# Patient Record
Sex: Male | Born: 1970 | Race: White | Hispanic: No | Smoking: Never smoker
Health system: Southern US, Community
[De-identification: ages and names within clinical notes are randomized; demographics above are authoritative.]

## PROBLEM LIST (undated history)

## (undated) DIAGNOSIS — E785 Hyperlipidemia, unspecified: Secondary | ICD-10-CM

## (undated) DIAGNOSIS — G4733 Obstructive sleep apnea (adult) (pediatric): Secondary | ICD-10-CM

## (undated) DIAGNOSIS — R112 Nausea with vomiting, unspecified: Secondary | ICD-10-CM

## (undated) DIAGNOSIS — I5032 Chronic diastolic (congestive) heart failure: Secondary | ICD-10-CM

## (undated) DIAGNOSIS — I10 Essential (primary) hypertension: Secondary | ICD-10-CM

## (undated) DIAGNOSIS — R931 Abnormal findings on diagnostic imaging of heart and coronary circulation: Secondary | ICD-10-CM

## (undated) DIAGNOSIS — M1990 Unspecified osteoarthritis, unspecified site: Secondary | ICD-10-CM

## (undated) DIAGNOSIS — Z9889 Other specified postprocedural states: Secondary | ICD-10-CM

## (undated) DIAGNOSIS — K219 Gastro-esophageal reflux disease without esophagitis: Secondary | ICD-10-CM

## (undated) DIAGNOSIS — I4891 Unspecified atrial fibrillation: Secondary | ICD-10-CM

## (undated) DIAGNOSIS — K7581 Nonalcoholic steatohepatitis (NASH): Secondary | ICD-10-CM

## (undated) DIAGNOSIS — S069X9A Unspecified intracranial injury with loss of consciousness of unspecified duration, initial encounter: Secondary | ICD-10-CM

## (undated) HISTORY — PX: TYMPANOSTOMY TUBE PLACEMENT: SHX32

## (undated) HISTORY — DX: Unspecified atrial fibrillation: I48.91

## (undated) HISTORY — DX: Nonalcoholic steatohepatitis (NASH): K75.81

## (undated) HISTORY — PX: TONSILLECTOMY: SUR1361

## (undated) HISTORY — DX: Obstructive sleep apnea (adult) (pediatric): G47.33

## (undated) HISTORY — DX: Chronic diastolic (congestive) heart failure: I50.32

## (undated) HISTORY — DX: Abnormal findings on diagnostic imaging of heart and coronary circulation: R93.1

## (undated) HISTORY — DX: Hyperlipidemia, unspecified: E78.5

---

## 1992-07-14 DIAGNOSIS — S069XAA Unspecified intracranial injury with loss of consciousness status unknown, initial encounter: Secondary | ICD-10-CM

## 1992-07-14 DIAGNOSIS — S069X9A Unspecified intracranial injury with loss of consciousness of unspecified duration, initial encounter: Secondary | ICD-10-CM

## 1992-07-14 HISTORY — DX: Unspecified intracranial injury with loss of consciousness of unspecified duration, initial encounter: S06.9X9A

## 1992-07-14 HISTORY — DX: Unspecified intracranial injury with loss of consciousness status unknown, initial encounter: S06.9XAA

## 2009-11-02 ENCOUNTER — Ambulatory Visit: Payer: Self-pay | Admitting: Ophthalmology

## 2016-03-19 ENCOUNTER — Encounter: Payer: Self-pay | Admitting: Emergency Medicine

## 2016-03-19 ENCOUNTER — Emergency Department
Admission: EM | Admit: 2016-03-19 | Discharge: 2016-03-19 | Disposition: A | Discharge status: Home / Self Care | Payer: 59 | Attending: Emergency Medicine | Admitting: Emergency Medicine

## 2016-03-19 DIAGNOSIS — R51 Headache: Secondary | ICD-10-CM

## 2016-03-19 DIAGNOSIS — H6092 Unspecified otitis externa, left ear: Secondary | ICD-10-CM | POA: Insufficient documentation

## 2016-03-19 DIAGNOSIS — H65192 Other acute nonsuppurative otitis media, left ear: Secondary | ICD-10-CM

## 2016-03-19 DIAGNOSIS — R519 Headache, unspecified: Secondary | ICD-10-CM

## 2016-03-19 HISTORY — DX: Unspecified intracranial injury with loss of consciousness of unspecified duration, initial encounter: S06.9X9A

## 2016-03-19 LAB — CBC
HCT: 45.8 % (ref 40.0–52.0)
Hemoglobin: 16.7 g/dL (ref 13.0–18.0)
MCH: 30 pg (ref 26.0–34.0)
MCHC: 36.5 g/dL — ABNORMAL HIGH (ref 32.0–36.0)
MCV: 82.2 fL (ref 80.0–100.0)
Platelets: 249 10*3/uL (ref 150–440)
RBC: 5.57 MIL/uL (ref 4.40–5.90)
RDW: 13.8 % (ref 11.5–14.5)
WBC: 6.5 10*3/uL (ref 3.8–10.6)

## 2016-03-19 LAB — BASIC METABOLIC PANEL
Anion gap: 7 (ref 5–15)
BUN: 14 mg/dL (ref 6–20)
CO2: 25 mmol/L (ref 22–32)
Calcium: 9.4 mg/dL (ref 8.9–10.3)
Chloride: 106 mmol/L (ref 101–111)
Creatinine, Ser: 0.93 mg/dL (ref 0.61–1.24)
GFR calc Af Amer: >60 mL/min (ref >60)
GFR calc non Af Amer: >60 mL/min (ref >60)
Glucose, Bld: 110 mg/dL — ABNORMAL HIGH (ref 65–99)
Potassium: 4.4 mmol/L (ref 3.5–5.1)
Sodium: 138 mmol/L (ref 135–145)

## 2016-03-19 MED ORDER — BUTALBITAL-APAP-CAFFEINE 50-325-40 MG PO TABS: 1.0000 | ORAL_TABLET | Freq: Three times a day (TID) | ORAL | 0 refills | Status: AC | PRN

## 2016-03-19 MED ORDER — CIPROFLOXACIN-DEXAMETHASONE 0.3-0.1 % OT SUSP: 4.0000 [drp] | Freq: Two times a day (BID) | OTIC | 0 refills | Status: DC

## 2016-03-19 NOTE — ED Triage Notes (Signed)
Pt complains of pain to back of head on left side that started Sunday night. Pt states he noticed his vision was blurred on Sunday as well but has gotten better. Pt also complains of ringing/throbbing to left ear for one week.
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
## 2016-03-19 NOTE — Discharge Instructions (Signed)
As we discussed, it appears that you have swimmers ear (otitis externa) in the left ear and the very beginning of it in the right ear.  Additionally he seemed to have a little bit of fluid behind her left ear drum.  At this point you do not need oral antibiotics but you would benefit from some eardrops to treat the otitis externa.  Please take eardrops for the full course of treatment recommended.  Follow up with an ENT specialist if you continue to have issues in about a week.  Additionally, we discussed your headache.  It may or may not be related to your ear problems, but since your headache has completely resolved at this time and your exam is otherwise reassuring, we did not proceed with any imaging today.  If you develop new or worsening symptoms that concern you, please return immediately to the emergency department.

## 2016-03-19 NOTE — ED Notes (Signed)
Pt unable to sign d/c instructions due to e sig not working.

## 2017-03-22 ENCOUNTER — Encounter: Payer: Self-pay | Admitting: *Deleted

## 2017-03-22 ENCOUNTER — Ambulatory Visit
Admission: EM | Admit: 2017-03-22 | Discharge: 2017-03-22 | Disposition: A | Discharge status: Home / Self Care | Payer: 59 | Attending: Family Medicine | Admitting: Family Medicine

## 2017-03-22 ENCOUNTER — Ambulatory Visit (INDEPENDENT_AMBULATORY_CARE_PROVIDER_SITE_OTHER): Payer: 59

## 2017-03-22 DIAGNOSIS — M7651 Patellar tendinitis, right knee: Secondary | ICD-10-CM

## 2017-03-22 DIAGNOSIS — M25561 Pain in right knee: Secondary | ICD-10-CM | POA: Diagnosis not present

## 2017-03-22 DIAGNOSIS — M25461 Effusion, right knee: Secondary | ICD-10-CM | POA: Diagnosis not present

## 2017-03-22 DIAGNOSIS — I10 Essential (primary) hypertension: Secondary | ICD-10-CM | POA: Diagnosis not present

## 2017-03-22 DIAGNOSIS — S8391XA Sprain of unspecified site of right knee, initial encounter: Secondary | ICD-10-CM

## 2017-03-22 HISTORY — DX: Essential (primary) hypertension: I10

## 2017-03-22 MED ORDER — MELOXICAM 15 MG PO TABS: 15.0000 mg | ORAL_TABLET | Freq: Every day | ORAL | 0 refills | Status: DC

## 2017-03-22 NOTE — ED Provider Notes (Signed)
[facility]    CSN: [id number] Arrival date & time: 03/22/17  1106     History   Chief Complaint Chief Complaint  Patient presents with  . Knee Pain    HPI Ernest Boyd is a 46 y.o. male.   Patient is a 46 year old white male who injured his right knee on Friday. 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 Think much of it according to him. However when he got home his 80s and 90 pounds dog which she states is a mix between a pitbull and a lab greeted him and was very playful. He tried to brace himself as a dog was jumping on him but felt his right knee give way and as he puts it felt bone bone. States started having trouble with right knee right away. The knee was more swollen yesterday his accident gone down some they still having some discomfort in the right knee. He has abrasion over the right knee that occurred at work. He's had a history of brain injury hypertension no pertinent surgical history he is on no chronic medications. He does not smoke tobacco and father with history of hypertension. He is allergic to Dilantin   The history is provided by the patient. No language interpreter was used.  Knee Pain  Location:  Knee Time since incident:  3 days Injury: yes   Mechanism of injury comment:  Being pushed by the dog Knee location:  R knee Pain details:    Quality:  Aching, pressure and shooting   Severity:  Moderate   Onset quality:  Sudden   Duration:  3 days   Timing:  Constant   Progression:  Partially resolved Chronicity:  New Dislocation: no   Foreign body present:  No foreign bodies Prior injury to area:  Yes Relieved by:  Nothing Worsened by:  Nothing   Past Medical History:  Diagnosis Date  . 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[place])   . Hypertension     There are no active problems to display for this patient.   History reviewed. No  pertinent surgical history.     Home Medications    Prior to Admission medications   Medication Sig Start Date End Date Taking? Authorizing Provider  ciprofloxacin-dexamethasone (CIPRODEX) otic suspension Place 4 drops into both ears 2 (two) times daily. Continue treatment for 7 days. 03/19/16   Hinda Kehr, MD    Family History Family History  Problem Relation Age of Onset  . Hypertension Father     Social History Social History  Substance Use Topics  . Smoking status: Never Smoker  . Smokeless tobacco: Never Used  . Alcohol use No     Allergies   Dilantin [phenytoin]   Review of Systems Review of Systems  Musculoskeletal: Positive for arthralgias, gait problem, joint swelling and myalgias.  All other systems reviewed and are negative.    Physical Exam Triage Vital Signs ED Triage Vitals  Enc Vitals Group     BP 03/22/17 1119 (!) 180/122     Pulse Rate 03/22/17 1119 66     Resp 03/22/17 1119 16     Temp 03/22/17 1119 98.2 F (36.8 C)     Temp Source 03/22/17 1119 Oral     SpO2 03/22/17 1119 100 %     Weight 03/22/17 1120 240 lb (108.9 kg)  Height 03/22/17 1120 5' 11"  (1.803 m)     Head Circumference --      Peak Flow --      Pain Score 03/22/17 1120 5     Pain Loc --      Pain Edu? --      Excl. in [place]? --    No data found.   Updated Vital Signs BP (!) 180/122 (BP Location: Right Arm)   Pulse 66   Temp 98.2 F (36.8 C) (Oral)   Resp 16   Ht 5' 11"  (1.803 m)   Wt 240 lb (108.9 kg)   SpO2 100%   BMI 33.47 kg/m   Visual Acuity Right Eye Distance:   Left Eye Distance:   Bilateral Distance:    Right Eye Near:   Left Eye Near:    Bilateral Near:     Physical Exam  Constitutional: He is oriented to person, place, and time. He appears well-developed and well-nourished. No distress.  HENT:  Head: Normocephalic and atraumatic.  Right Ear: External ear normal.  Left Ear: External ear normal.  Nose: Nose normal.  Eyes: Pupils are equal,  round, and reactive to light.  Neck: Normal range of motion.  Cardiovascular: Normal rate and regular rhythm.   Pulmonary/Chest: Effort normal.  Musculoskeletal: He exhibits tenderness.       Right knee: He exhibits swelling. He exhibits normal range of motion, no ecchymosis and no deformity. Tenderness found.       Legs: The knee joint itself appears be stable he has some mild effusion around the knee and tenderness on the superior and inferior lateral sides of the right knee the some mild tenderness in the popliteal area as well but overall the knee appears to be stable  Neurological: He is alert and oriented to person, place, and time.  Skin: Skin is warm. He is not diaphoretic.  Psychiatric: He has a normal mood and affect.  Vitals reviewed.    UC Treatments / Results  Labs (all labs ordered are listed, but only abnormal results are displayed) Labs Reviewed - No data to display  EKG  EKG Interpretation None       Radiology No results found.  Procedures Procedures (including critical care time)  Medications Ordered in UC Medications - No data to display   Initial Impression / Assessment and Plan / UC Course  I have reviewed the triage vital signs and the nursing notes.  Pertinent labs & imaging results that were available during my care of the patient were reviewed by me and considered in my medical decision making (see chart for details).     We'll x-ray the right knee if stable but normal will place on Mobic 15 mg. Recommend he get a knee sleeve at the local drugstore or supply shop work note given for today and tomorrow needed Mobic 15 mg 1 tablet by mouth daily if not better in 1-2 weeks please see PCP may need orthopedic referral that time.   Patient also had markedly elevated blood pressure his blood pressure was rechecked and it has come down some. It turns out the patient has been on blood pressure was before. For whatever reason he is no longer taking blood  pressure medicine information with PCP was given to him with instructions that he needs follow-up with his PCP to get back on blood pressure medicine. Elevated whether we should start him on blood pressure medicine now but since he has a history of hypertension and is  been off his medicine will hold off on putting him back on medication now  Final Clinical Impressions(s) / UC Diagnoses   Final diagnoses:  None    New Prescriptions New Prescriptions   No medications on file   Note: This dictation was prepared with Dragon dictation along with smaller phrase technology. Any transcriptional errors that result from this process are unintentional.  Controlled Substance Prescriptions [place] Controlled Substance Registry consulted? Not Applicable   Frederich Cha, MD 03/24/17 2119

## 2017-03-22 NOTE — ED Triage Notes (Signed)
Patient injured his right knee 2 days ago when he hit his right knee cap against a piece of metal.

## 2017-10-25 ENCOUNTER — Emergency Department
Admission: EM | Admit: 2017-10-25 | Discharge: 2017-10-26 | Disposition: A | Discharge status: Home / Self Care | Payer: 59 | Attending: Emergency Medicine | Admitting: Emergency Medicine

## 2017-10-25 ENCOUNTER — Other Ambulatory Visit: Payer: Self-pay

## 2017-10-25 DIAGNOSIS — R1010 Upper abdominal pain, unspecified: Secondary | ICD-10-CM | POA: Diagnosis not present

## 2017-10-25 DIAGNOSIS — Z5321 Procedure and treatment not carried out due to patient leaving prior to being seen by health care provider: Secondary | ICD-10-CM | POA: Diagnosis not present

## 2017-10-25 LAB — CBC
HCT: 42.9 % (ref 40.0–52.0)
Hemoglobin: 15.2 g/dL (ref 13.0–18.0)
MCH: 29.2 pg (ref 26.0–34.0)
MCHC: 35.4 g/dL (ref 32.0–36.0)
MCV: 82.6 fL (ref 80.0–100.0)
Platelets: 250 10*3/uL (ref 150–440)
RBC: 5.2 MIL/uL (ref 4.40–5.90)
RDW: 13.4 % (ref 11.5–14.5)
WBC: 9.8 10*3/uL (ref 3.8–10.6)

## 2017-10-25 LAB — COMPREHENSIVE METABOLIC PANEL
ALT: 25 U/L (ref 17–63)
AST: 25 U/L (ref 15–41)
Albumin: 4.3 g/dL (ref 3.5–5.0)
Alkaline Phosphatase: 53 U/L (ref 38–126)
Anion gap: 8 (ref 5–15)
BUN: 14 mg/dL (ref 6–20)
CO2: 26 mmol/L (ref 22–32)
Calcium: 9.2 mg/dL (ref 8.9–10.3)
Chloride: 100 mmol/L — ABNORMAL LOW (ref 101–111)
Creatinine, Ser: 0.96 mg/dL (ref 0.61–1.24)
GFR calc Af Amer: >60 mL/min (ref >60)
GFR calc non Af Amer: >60 mL/min (ref >60)
Glucose, Bld: 145 mg/dL — ABNORMAL HIGH (ref 65–99)
Potassium: 3.8 mmol/L (ref 3.5–5.1)
Sodium: 134 mmol/L — ABNORMAL LOW (ref 135–145)
Total Bilirubin: 0.7 mg/dL (ref 0.3–1.2)
Total Protein: 7.9 g/dL (ref 6.5–8.1)

## 2017-10-25 LAB — LIPASE, BLOOD: Lipase: 25 U/L (ref 11–51)

## 2017-10-25 NOTE — ED Triage Notes (Signed)
Pt reports indigestion at 5am today and took tums with relief.  1700 today pt reports upper abd pain.  No v/d. Small bm today.  Pt alert.  No chest pain.

## 2017-10-25 NOTE — ED Notes (Signed)
Unable to void at this time

## 2017-10-26 LAB — TROPONIN I: Troponin I: <0.03 ng/mL (ref <0.03)

## 2018-01-07 ENCOUNTER — Encounter: Payer: Self-pay | Admitting: *Deleted

## 2018-01-19 ENCOUNTER — Encounter: Payer: Self-pay | Admitting: General Surgery

## 2018-01-19 ENCOUNTER — Ambulatory Visit: Payer: 59 | Admitting: General Surgery

## 2018-01-19 VITALS — BP 124/90 | HR 64 | Resp 10 | Ht 71.0 in | Wt 245.0 lb

## 2018-01-19 DIAGNOSIS — K8020 Calculus of gallbladder without cholecystitis without obstruction: Secondary | ICD-10-CM | POA: Diagnosis not present

## 2018-01-19 NOTE — Patient Instructions (Addendum)
He will decide about surgery and call back.  Laparoscopic Cholecystectomy Laparoscopic cholecystectomy is surgery to remove the gallbladder. The gallbladder is a pear-shaped organ that lies beneath the liver on the right side of the body. The gallbladder stores bile, which is a fluid that helps the body to digest fats. Cholecystectomy is often done for inflammation of the gallbladder (cholecystitis). This condition is usually caused by a buildup of gallstones (cholelithiasis) in the gallbladder. Gallstones can block the flow of bile, which can result in inflammation and pain. In severe cases, emergency surgery may be required. This procedure is done though small incisions in your abdomen (laparoscopic surgery). A thin scope with a camera (laparoscope) is inserted through one incision. Thin surgical instruments are inserted through the other incisions. In some cases, a laparoscopic procedure may be turned into a type of surgery that is done through a larger incision (open surgery). Tell a health care provider about:  Any allergies you have.  All medicines you are taking, including vitamins, herbs, eye drops, creams, and over-the-counter medicines.  Any problems you or family members have had with anesthetic medicines.  Any blood disorders you have.  Any surgeries you have had.  Any medical conditions you have.  Whether you are pregnant or may be pregnant. What are the risks? Generally, this is a safe procedure. However, problems may occur, including:  Infection.  Bleeding.  Allergic reactions to medicines.  Damage to other structures or organs.  A stone remaining in the common bile duct. The common bile duct carries bile from the gallbladder into the small intestine.  A bile leak from the cyst duct that is clipped when your gallbladder is removed.  What happens before the procedure? Staying hydrated Follow instructions from your health care provider about hydration, which  may include:  Up to 2 hours before the procedure - you may continue to drink clear liquids, such as water, clear fruit juice, black coffee, and plain tea.  Eating and drinking restrictions Follow instructions from your health care provider about eating and drinking, which may include:  8 hours before the procedure - stop eating heavy meals or foods such as meat, fried foods, or fatty foods.  6 hours before the procedure - stop eating light meals or foods, such as toast or cereal.  6 hours before the procedure - stop drinking milk or drinks that contain milk.  2 hours before the procedure - stop drinking clear liquids.  Medicines  Ask your health care provider about: ? Changing or stopping your regular medicines. This is especially important if you are taking diabetes medicines or blood thinners. ? Taking medicines such as aspirin and ibuprofen. These medicines can thin your blood. Do not take these medicines before your procedure if your health care provider instructs you not to.  You may be given antibiotic medicine to help prevent infection. General instructions  Let your health care provider know if you develop a cold or an infection before surgery.  Plan to have someone take you home from the hospital or clinic.  Ask your health care provider how your surgical site will be marked or identified. What happens during the procedure?  To reduce your risk of infection: ? Your health care team will wash or sanitize their hands. ? Your skin will be washed with soap. ? Hair may be removed from the surgical area.  An IV tube may be inserted into one of your veins.  You will be given one or  more of the following: ? A medicine to help you relax (sedative). ? A medicine to make you fall asleep (general anesthetic).  A breathing tube will be placed in your mouth.  Your surgeon will make several small cuts (incisions) in your abdomen.  The laparoscope will be inserted through one  of the small incisions. The camera on the laparoscope will send images to a TV screen (monitor) in the operating room. This lets your surgeon see inside your abdomen.  Air-like gas will be pumped into your abdomen. This will expand your abdomen to give the surgeon more room to perform the surgery.  Other tools that are needed for the procedure will be inserted through the other incisions. The gallbladder will be removed through one of the incisions.  Your common bile duct may be examined. If stones are found in the common bile duct, they may be removed.  After your gallbladder has been removed, the incisions will be closed with stitches (sutures), staples, or skin glue.  Your incisions may be covered with a bandage (dressing). The procedure may vary among health care providers and hospitals. What happens after the procedure?  Your blood pressure, heart rate, breathing rate, and blood oxygen level will be monitored until the medicines you were given have worn off.  You will be given medicines as needed to control your pain.  Do not drive for 24 hours if you were given a sedative. This information is not intended to replace advice given to you by your health care provider. Make sure you discuss any questions you have with your health care provider. Document Released: 06/30/2005 Document Revised: 01/20/2016 Document Reviewed: 12/17/2015 Elsevier Interactive Patient Education  2018 [employer].

## 2018-01-19 NOTE — Progress Notes (Signed)
Patient ID: Ernest Boyd, male   DOB: 01/29/1971, 47 y.o.   MRN: [id number]  Chief Complaint  Patient presents with  . Abdominal Pain    HPI NELS Boyd is a 47 y.o. male here today for a evaluation of abdominal pain  area. He has had 3 episodes of central and right upper abdominal pain over the past year. Radiating to his shoulder and back. The pain last 3-4 hours this last episode lasted 10 hours. He states the last episode was the worse and he had nausea.. Denies nausea or vomiting with the first 2 episodes. Each time was over eating and greasy foods. Bowels move twice a day, no bleeding. He has been watching what he eats and seems it has helped. Ultrasound done 10-26-17.  He is here with his girlfriend of 3 years, Cleotilde Neer.  HPI  Past Medical History:  Diagnosis Date  . 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[place])   . Hyperlipidemia   . Hypertension   . NASH (nonalcoholic steatohepatitis)     Past Surgical History:  Procedure Laterality Date  . TONSILLECTOMY      Family History  Problem Relation Age of Onset  . Hypertension Father     Social History Social History   Tobacco Use  . Smoking status: Never Smoker  . Smokeless tobacco: Never Used  Substance Use Topics  . Alcohol use: No  . Drug use: Not Currently    Allergies  Allergen Reactions  . Dilantin [Phenytoin] Rash    Current Outpatient Medications  Medication Sig Dispense Refill  . cholecalciferol (VITAMIN D) 1000 units tablet Take 1,000 Units by mouth daily.    Marland Kitchen co-enzyme Q-10 30 MG capsule Take 30 mg by mouth daily.    Marland Kitchen lisinopril (PRINIVIL,ZESTRIL) 10 MG tablet     . simvastatin (ZOCOR) 40 MG tablet      No current facility-administered medications for this visit.     Review of Systems Review of Systems  Constitutional: Negative.   Respiratory: Negative.   Cardiovascular: Negative.     Blood pressure 124/90, pulse 64, resp. rate 10, height 5' 11"  (1.803 m), weight 245 lb (111.1 kg), SpO2 97 %.  Physical  Exam Physical Exam  Constitutional: He is oriented to person, place, and time. He appears well-developed and well-nourished.  HENT:  Mouth/Throat: Oropharynx is clear and moist.  Eyes: No scleral icterus.  Cardiovascular: Normal rate, regular rhythm and normal heart sounds.  Pulmonary/Chest: Effort normal and breath sounds normal.  Abdominal: Soft. Normal appearance and bowel sounds are normal.    Neurological: He is alert and oriented to person, place, and time.  Skin: Skin is warm and dry.  Psychiatric: His behavior is normal.    Data Reviewed Laboratory studies obtained during an ED visit on October 25, 2017 were reviewed.  Normal white blood cell count, hemoglobin and MCV.  Platelet count 250,000.  Comprehensive metabolic panel notable for mild hyponatremia and elevated blood sugar 145.  Normal liver function studies.  Normal renal function.  Normal lipase.  The patient registered for the emergency room but after 6 hours return home without being seen.  Abdominal ultrasound dated October 26, 2017 completed at his PCPs office reported cholelithiasis without evidence of biliary tract obstruction.  Hepatic lobe enlargement on the right with fatty infiltration identified.  Assessment    Symptomatic cholelithiasis.    Plan    He will decide about surgery and call back.  Laparoscopic Cholecystectomy with Intraoperative Cholangiogram. The procedure, including it's potential risks  and complications (including but not limited to infection, bleeding, injury to intra-abdominal organs or bile ducts, bile leak, poor cosmetic result, sepsis and death) were discussed with the patient in detail. Non-operative options, including their inherent risks (acute calculous cholecystitis with possible choledocholithiasis or gallstone pancreatitis, with the risk of ascending cholangitis, sepsis, and death) were discussed as well. The patient expressed and understanding of what we discussed and he will call  back when he wishes to proceed with laparoscopic cholecystectomy. The patient further understands that if it is technically not possible, or it is unsafe to proceed laparoscopically, that I will convert to an open cholecystectomy.      HPI, Physical Exam, Assessment and Plan have been scribed under the direction and in the presence of Robert Bellow, MD. Karie Fetch, RN  I have completed the exam and reviewed the above documentation for accuracy and completeness.  I agree with the above.  Haematologist has been used and any errors in dictation or transcription are unintentional.  Hervey Ard, M.D., F.A.C.S.   Forest Gleason Keondra Haydu 01/19/2018, 8:44 PM

## 2019-08-28 IMAGING — CR DG KNEE AP/LAT W/ SUNRISE*R*
3 series · 3 of 3 positions shown · non-contrast
Comparison: None.

CLINICAL DATA: PT with posterior right knee pian with pain under
the knee cap as well x two days. No hx of previous injury or
trauma.NO hx of surgery.

EXAM:
RIGHT KNEE 3 VIEWS

[knee ap]
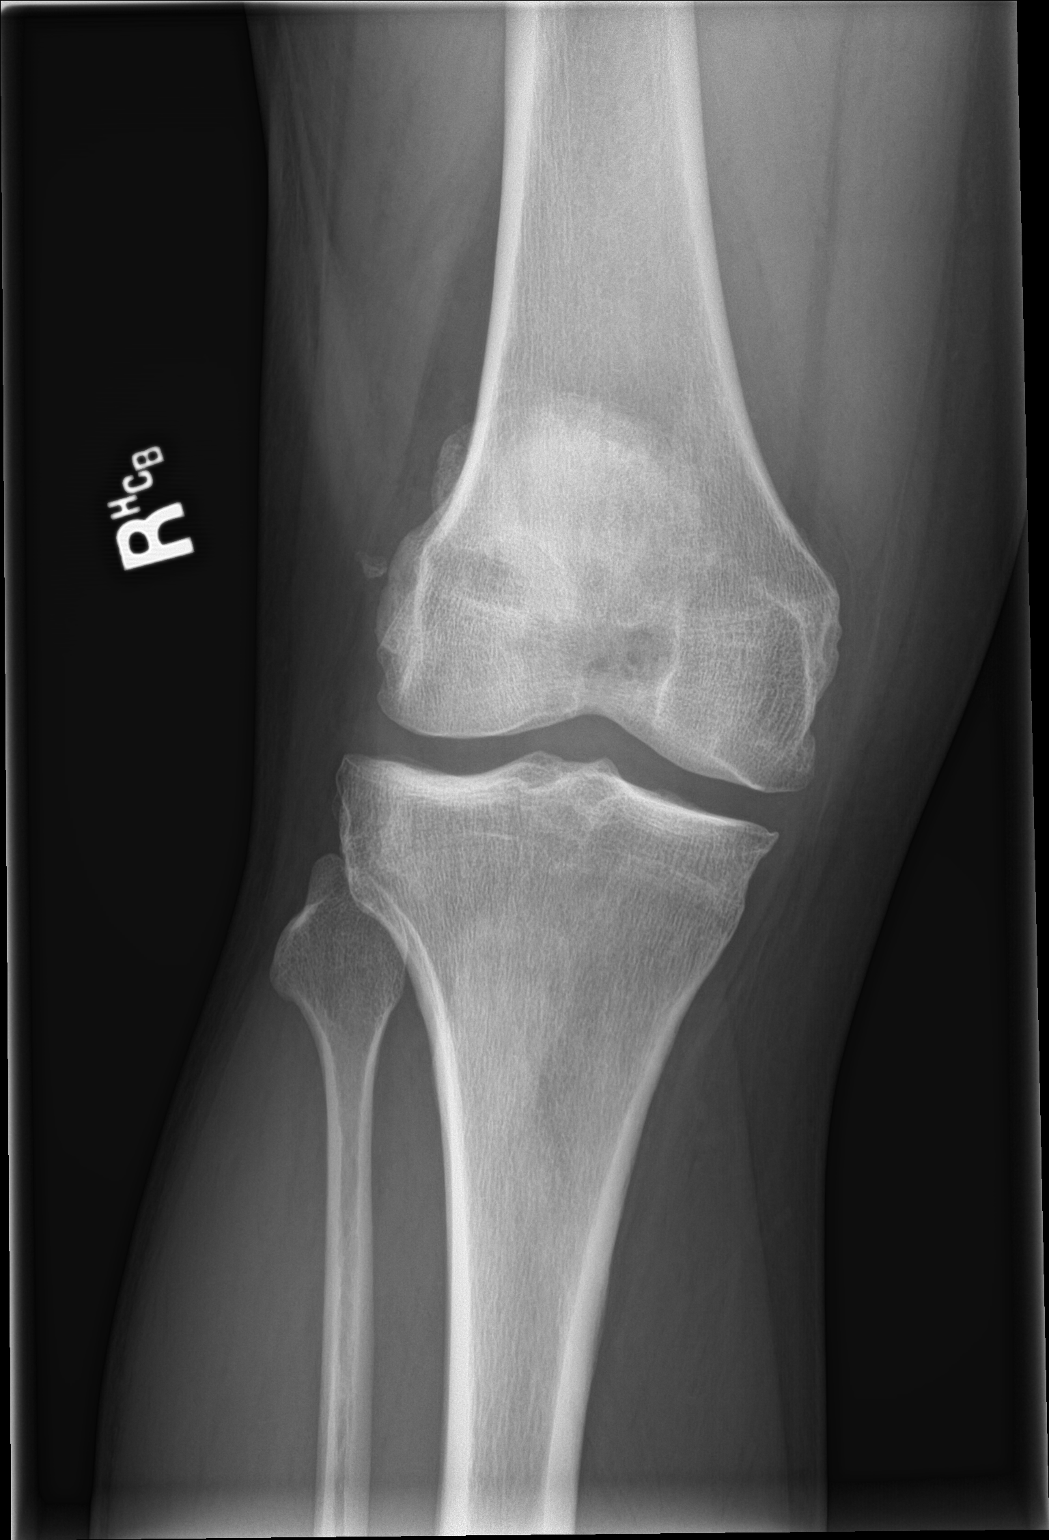

[knee lat]
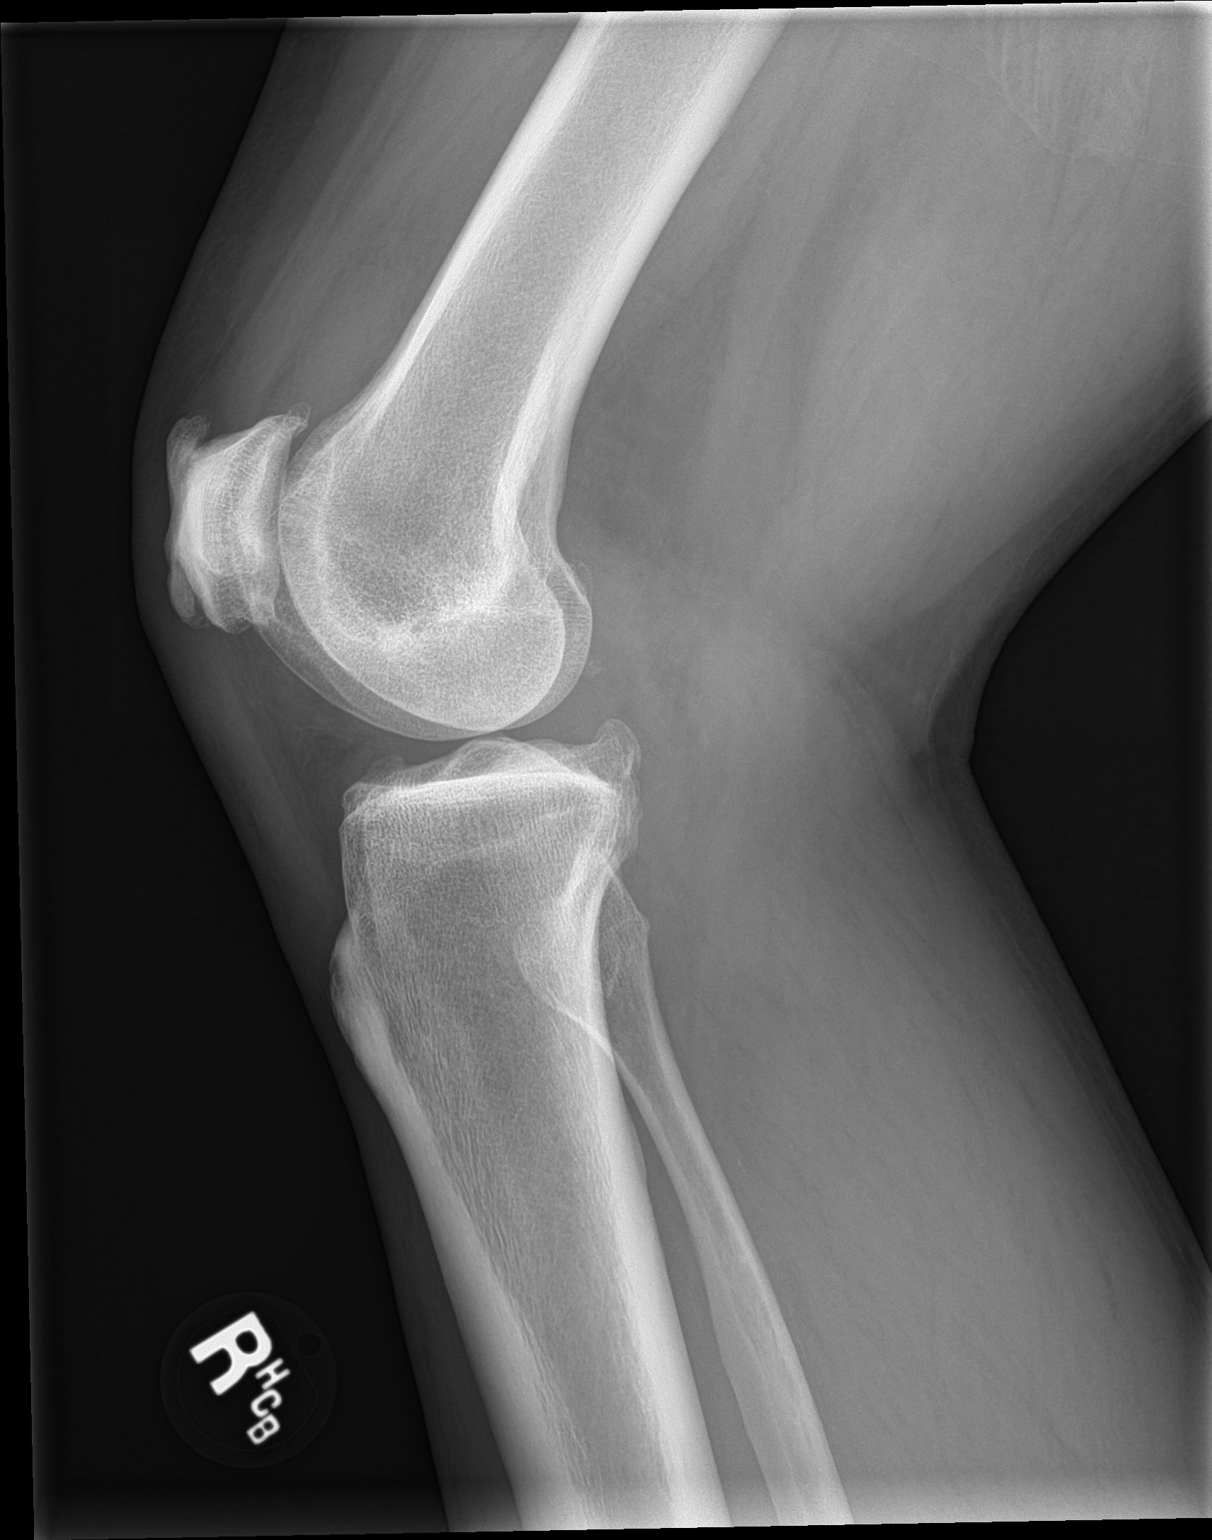

[patella skyline]
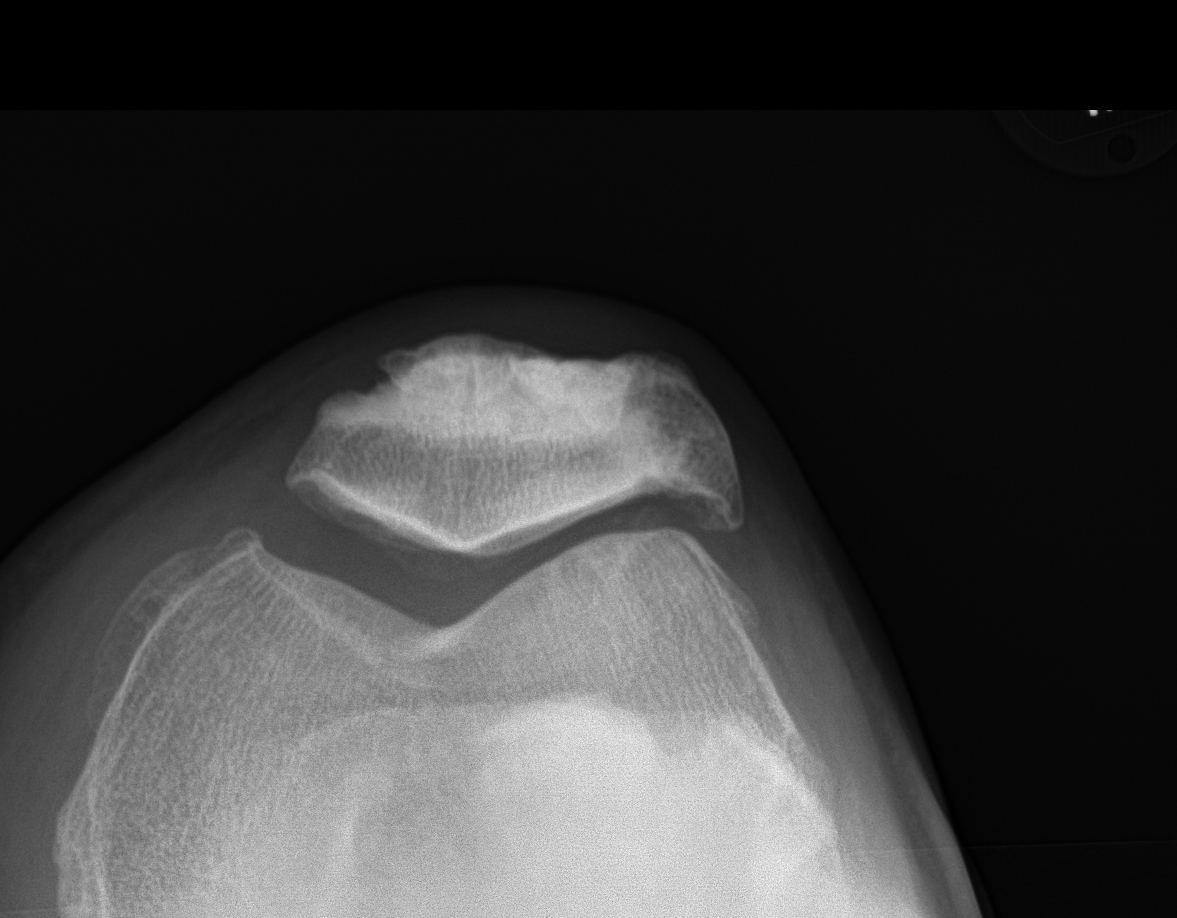

[3 of 3 positions shown; findings below may reference images not displayed]

FINDINGS: Osseous alignment is normal. Bone mineralization is normal. No no
fracture. No acute or suspicious osseous lesion.

Chronic enthesopathy along the anterior margin of the patella,
involving both the quadriceps and patellar tendon insertion sites,
compatible with chronic tendinopathy. Small calcific fragment along
the lateral joint line, suggesting previous lateral collateral
ligament injury.

Small joint effusion within the suprapatellar bursa. Superficial
soft tissues are unremarkable.
IMPRESSION: 1. No acute appearing osseous abnormality.
2. Joint effusion.
3. Patellar enthesopathy.
4. Small calcification along the lateral joint line, suggesting
previous lateral collateral ligament injury.

## 2019-10-09 ENCOUNTER — Ambulatory Visit: Payer: Self-pay

## 2019-10-09 ENCOUNTER — Ambulatory Visit: Payer: 59 | Attending: Internal Medicine

## 2019-10-09 DIAGNOSIS — Z23 Encounter for immunization: Secondary | ICD-10-CM

## 2019-10-09 NOTE — Progress Notes (Signed)
   Covid-19 Vaccination Clinic  Name:  Ernest Boyd    MRN: [id number] DOB: June 07, 1971  10/09/2019  [name] was observed post Covid-19 immunization for 15 minutes without incident. He was provided with Vaccine Information Sheet and instruction to access the V-Safe system.   [name] was instructed to call 911 with any severe reactions post vaccine: Marland Kitchen Difficulty breathing  . Swelling of face and throat  . A fast heartbeat  . A bad rash all over body  . Dizziness and weakness   Immunizations Administered    Name Date Dose VIS Date Route   Pfizer COVID-19 Vaccine 10/09/2019  2:54 PM 0.3 mL 06/24/2019 Intramuscular   Manufacturer: [place]   Lot: [id number]   [place]: [address]-4

## 2019-11-02 ENCOUNTER — Ambulatory Visit: Payer: 59 | Attending: Internal Medicine

## 2019-11-02 DIAGNOSIS — Z23 Encounter for immunization: Secondary | ICD-10-CM

## 2019-11-02 NOTE — Progress Notes (Signed)
   Covid-19 Vaccination Clinic  Name:  LIBERATO STANSBERY    MRN: [id number] DOB: [birth year]  11/02/2019  Mr. Allemand was observed post Covid-19 immunization for 15 minutes without incident. He was provided with Vaccine Information Sheet and instruction to access the V-Safe system.   Mr. Borre was instructed to call 911 with any severe reactions post vaccine: Marland Kitchen Difficulty breathing  . Swelling of face and throat  . A fast heartbeat  . A bad rash all over body  . Dizziness and weakness   Immunizations Administered    Name Date Dose VIS Date Route   Pfizer COVID-19 Vaccine 11/02/2019 11:16 AM 0.3 mL 09/07/2018 Intramuscular   Manufacturer: [place]   Lot: [id number]   [place]: [id number]

## 2020-04-21 ENCOUNTER — Other Ambulatory Visit: Payer: Self-pay

## 2020-04-21 ENCOUNTER — Ambulatory Visit
Admission: EM | Admit: 2020-04-21 | Discharge: 2020-04-21 | Disposition: A | Discharge status: Home / Self Care | Payer: 59 | Attending: Emergency Medicine | Admitting: Emergency Medicine

## 2020-04-21 DIAGNOSIS — G8929 Other chronic pain: Secondary | ICD-10-CM

## 2020-04-21 DIAGNOSIS — M25561 Pain in right knee: Secondary | ICD-10-CM | POA: Diagnosis not present

## 2020-04-21 LAB — BASIC METABOLIC PANEL
Anion gap: 9 (ref 5–15)
BUN: 12 mg/dL (ref 6–20)
CO2: 24 mmol/L (ref 22–32)
Calcium: 9.3 mg/dL (ref 8.9–10.3)
Chloride: 103 mmol/L (ref 98–111)
Creatinine, Ser: 0.91 mg/dL (ref 0.61–1.24)
GFR, Estimated: >60 mL/min (ref >60)
Glucose, Bld: 111 mg/dL — ABNORMAL HIGH (ref 70–99)
Potassium: 3.8 mmol/L (ref 3.5–5.1)
Sodium: 136 mmol/L (ref 135–145)

## 2020-04-21 MED ORDER — TRAMADOL HCL 50 MG PO TABS: 50.0000 mg | ORAL_TABLET | Freq: Four times a day (QID) | ORAL | 0 refills | Status: DC | PRN

## 2020-04-21 MED ORDER — MELOXICAM 15 MG PO TABS: 15.0000 mg | ORAL_TABLET | Freq: Every day | ORAL | 1 refills | Status: DC

## 2020-04-21 NOTE — ED Provider Notes (Addendum)
[facility]    CSN: [id number] Arrival date & time: 04/21/20  1021      History   Chief Complaint Chief Complaint  Patient presents with  . Knee Pain    right    HPI Ernest Boyd is a 49 y.o. male.   49 yo male here for evaluation of right knee pain. No trauma. He has had right knee pain for years. He was evaluated here in September 2018 and X-rays showed chronic inflammation of the right patella, suprapatellar bursitis, and small joint effusion. He was evaluated by Waldemar Dickens in March after his right knee gave out and was diagnosed with arthritis and another effusion. He was treated with Meloxicam and a knee brace which helped. After the swelling wen down the brace made his pain worse. The pain is in the back right side of his knee. IT is worse when pressure from his car chair presses against his lower thigh and also if he tries to bend his right knee to 90 degrees or beyond. He lately has been having pain shoot down the front of his shin as well.      Past Medical History:  Diagnosis Date  . 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[place])   . Hyperlipidemia   . Hypertension   . NASH (nonalcoholic steatohepatitis)     Patient Active Problem List   Diagnosis Date Noted  . Calculus of gallbladder without cholecystitis without obstruction 01/19/2018    Past Surgical History:  Procedure Laterality Date  . TONSILLECTOMY         Home Medications    Prior to Admission medications   Medication Sig Start Date End Date Taking? Authorizing Provider  lisinopril (PRINIVIL,ZESTRIL) 10 MG tablet  01/13/18   [provider]  meloxicam (MOBIC) 15 MG tablet Take 1 tablet (15 mg total) by mouth daily. 04/21/20   Margarette Canada, NP  simvastatin (ZOCOR) 40 MG tablet  01/13/18   [provider]  traMADol (ULTRAM) 50 MG tablet Take 1 tablet (50 mg total) by mouth every 6 (six) hours as needed. 04/21/20   Margarette Canada, NP    Family History Family History  Problem Relation Age of Onset  .  Hypertension Father     Social History Social History   Tobacco Use  . Smoking status: Never Smoker  . Smokeless tobacco: Never Used  Vaping Use  . Vaping Use: Never used  Substance Use Topics  . Alcohol use: No  . Drug use: Not Currently     Allergies   Dilantin [phenytoin]   Review of Systems Review of Systems  Constitutional: Negative for activity change, appetite change and fever.  HENT: Negative for congestion and sore throat.   Respiratory: Negative for cough.   Cardiovascular: Negative for chest pain.  Gastrointestinal: Negative for nausea and vomiting.  Genitourinary: Negative for dysuria and frequency.  Musculoskeletal: Positive for arthralgias, joint swelling and myalgias. Negative for back pain and gait problem.  Neurological: Negative for weakness and numbness.  Psychiatric/Behavioral: Negative.      Physical Exam Triage Vital Signs ED Triage Vitals  Enc Vitals Group     BP 04/21/20 1055 (!) 166/110     Pulse Rate 04/21/20 1055 69     Resp 04/21/20 1055 17     Temp 04/21/20 1055 98.5 F (36.9 C)     Temp Source 04/21/20 1055 Oral     SpO2 04/21/20 1055 98 %     Weight 04/21/20 1053 230 lb (104.3 kg)  Height 04/21/20 1053 5' 11"  (1.803 m)     Head Circumference --      Peak Flow --      Pain Score 04/21/20 1052 6     Pain Loc --      Pain Edu? --      Excl. in [place]? --    No data found.  Updated Vital Signs BP (!) 166/110 (BP Location: Right Arm)   Pulse 69   Temp 98.5 F (36.9 C) (Oral)   Resp 17   Ht 5' 11"  (1.803 m)   Wt 230 lb (104.3 kg)   SpO2 98%   BMI 32.08 kg/m   Visual Acuity Right Eye Distance:   Left Eye Distance:   Bilateral Distance:    Right Eye Near:   Left Eye Near:    Bilateral Near:     Physical Exam Vitals and nursing note reviewed.  Constitutional:      General: He is not in acute distress.    Appearance: Normal appearance. He is not toxic-appearing.  HENT:     Head: Normocephalic and atraumatic.    Eyes:     General: No scleral icterus.    Extraocular Movements: Extraocular movements intact.     Conjunctiva/sclera: Conjunctivae normal.     Pupils: Pupils are equal, round, and reactive to light.  Cardiovascular:     Rate and Rhythm: Normal rate and regular rhythm.     Pulses: Normal pulses.     Heart sounds: Normal heart sounds. No murmur heard.  No gallop.   Pulmonary:     Effort: Pulmonary effort is normal. No respiratory distress.     Breath sounds: Normal breath sounds. No wheezing, rhonchi or rales.  Musculoskeletal:        General: Tenderness present. No swelling, deformity or signs of injury.     Cervical back: Normal range of motion and neck supple.     Right lower leg: No edema.     Left lower leg: No edema.     Comments: There is tenderness to palpation of the popliteal foss of the right knee posterolaterally. No cystic structures palpated. No patellar tenderness, no joint line tenderness, no effusion. Anterior and posterior drawer test negative. Varus and valgus stress tests negative.    Skin:    General: Skin is warm and dry.     Capillary Refill: Capillary refill takes less than 2 seconds.     Findings: No bruising, erythema or rash.  Neurological:     General: No focal deficit present.     Mental Status: He is alert and oriented to person, place, and time.     Motor: No weakness.  Psychiatric:        Mood and Affect: Mood normal.        Behavior: Behavior normal.        Thought Content: Thought content normal.        Judgment: Judgment normal.      UC Treatments / Results  Labs (all labs ordered are listed, but only abnormal results are displayed) Labs Reviewed  BASIC METABOLIC PANEL    EKG   Radiology No results found.  Procedures Procedures (including critical care time)  Medications Ordered in UC Medications - No data to display  Initial Impression / Assessment and Plan / UC Course  I have reviewed the triage vital signs and the nursing  notes.  Pertinent labs & imaging results that were available during my care of the patient were reviewed  by me and considered in my medical decision making (see chart for details).   Patient is here for evaluation of right knee pain. This is an ongoing problem for which he has seen [facility]. No instability or crepitus on exam. No effusion noted. There is tenderness to the popliteal fossa and the tenderness increases with passive and active ROM past 90 degrees of flexion. Homan's negative, right calf 39 cm, left calf 40 cm.  Presentation is suspicious for Baker's cyst and arthritis. Will treat with Meloxicam, have patient wear a compression sleeve, and follow-up with [facility]. Will draw BMP to check renal function given HTN history and possible long term therapy with Meloxicam. Will also give Tramadol for severe pain at bedtime.   Final Clinical Impressions(s) / UC Diagnoses   Final diagnoses:  Chronic pain of right knee     Discharge Instructions     Wear a compression sleeve on your right knee to help with pain and swelling.  Take the Meloxicam daily. Use the Tramadol for severe pain and at bedtime as needed. DO not work with electricity, drive, or drink alcohol if you take the Tramadol.  Follow-up with [facility].      ED Prescriptions    Medication Sig Dispense Auth. Provider   meloxicam (MOBIC) 15 MG tablet Take 1 tablet (15 mg total) by mouth daily. 30 tablet Margarette Canada, NP   traMADol (ULTRAM) 50 MG tablet Take 1 tablet (50 mg total) by mouth every 6 (six) hours as needed. 15 tablet Margarette Canada, NP     I have reviewed the PDMP during this encounter.   Margarette Canada, NP 04/21/20 1142    Margarette Canada, NP 04/21/20 1143

## 2020-04-21 NOTE — Discharge Instructions (Addendum)
Wear a compression sleeve on your right knee to help with pain and swelling.  Take the Meloxicam daily. Use the Tramadol for severe pain and at bedtime as needed. DO not work with electricity, drive, or drink alcohol if you take the Tramadol.  Follow-up with [facility].

## 2020-04-21 NOTE — ED Triage Notes (Signed)
Patient complains of right knee pain that started originally in march. States that this morning the swelling was worse and he did take tylenol. Patient states that knee feels stiff.   States that in march and April he saw [facility] and they told him he has some arthritis and meloxicam. States that he was placed in a brace but that made the swelling worse.

## 2020-09-13 ENCOUNTER — Encounter: Payer: Self-pay | Admitting: Surgery

## 2020-09-13 ENCOUNTER — Ambulatory Visit (INDEPENDENT_AMBULATORY_CARE_PROVIDER_SITE_OTHER): Payer: 59 | Admitting: Surgery

## 2020-09-13 ENCOUNTER — Ambulatory Visit: Payer: Self-pay | Admitting: Surgery

## 2020-09-13 ENCOUNTER — Other Ambulatory Visit: Payer: Self-pay

## 2020-09-13 VITALS — BP 179/120 | HR 67 | Temp 98.5°F | Ht 71.0 in | Wt 246.6 lb

## 2020-09-13 DIAGNOSIS — K8000 Calculus of gallbladder with acute cholecystitis without obstruction: Secondary | ICD-10-CM

## 2020-09-13 DIAGNOSIS — K8010 Calculus of gallbladder with chronic cholecystitis without obstruction: Secondary | ICD-10-CM

## 2020-09-13 NOTE — Progress Notes (Signed)
Patient ID: Ernest Boyd, male   DOB: 07-27-1970, 50 y.o.   MRN: [id number]  Chief Complaint: Right upper quadrant pain  History of Present Illness Ernest Boyd is a 50 y.o. male with a well-known history of cholelithiasis, diagnosed in 2019.  He was able to be essentially symptom-free for about a year and a half being extremely careful with his diet.  Unfortunately due to travel, he was eating out a lot and had a severe flareup in flight to [place].  He has had associated abdominal bloating, radiation to the back.  He denies nausea, vomiting, fevers or chills currently.  Reports regular bowel movements and denies acholic movements.  He denies any history of jaundice.  His last flareup was approximately 2 weeks ago that lasted 4 to 5 hours.  He waited in the ER and when the pain resolved he did not wait any longer.  Past Medical History Past Medical History:  Diagnosis Date  . 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[place])   . Hyperlipidemia   . Hypertension   . NASH (nonalcoholic steatohepatitis)       Past Surgical History:  Procedure Laterality Date  . TONSILLECTOMY      Allergies  Allergen Reactions  . Dilantin [Phenytoin] Rash    Current Outpatient Medications  Medication Sig Dispense Refill  . lisinopril (PRINIVIL,ZESTRIL) 10 MG tablet     . meloxicam (MOBIC) 15 MG tablet Take 1 tablet (15 mg total) by mouth daily. 30 tablet 1  . simvastatin (ZOCOR) 40 MG tablet      No current facility-administered medications for this visit.    Family History Family History  Problem Relation Age of Onset  . Hypertension Father       Social History Social History   Tobacco Use  . Smoking status: Never Smoker  . Smokeless tobacco: Never Used  Vaping Use  . Vaping Use: Never used  Substance Use Topics  . Alcohol use: No  . Drug use: Not Currently        Review of Systems  Constitutional: Negative.   HENT: Negative.   Eyes: Negative.   Respiratory: Negative.   Cardiovascular: Negative.    Gastrointestinal: Positive for abdominal pain. Negative for constipation, heartburn, nausea and vomiting.  Genitourinary: Negative.   Skin: Negative.   Neurological: Negative.   Psychiatric/Behavioral: Negative.       Physical Exam Blood pressure (!) 179/120, pulse 67, temperature 98.5 F (36.9 C), temperature source Oral, height 5' 11"  (1.803 m), weight 246 lb 9.6 oz (111.9 kg), SpO2 97 %. Last Weight  Most recent update: 09/13/2020  8:57 AM   Weight  111.9 kg (246 lb 9.6 oz)            CONSTITUTIONAL: Well developed, and nourished, appropriately responsive and aware without distress.   EYES: Sclera non-icteric.   EARS, NOSE, MOUTH AND THROAT: Mask worn.  Hearing is intact to voice.  NECK: Trachea is midline, and there is no jugular venous distension.  LYMPH NODES:  Lymph nodes in the neck are not enlarged. RESPIRATORY:  Lungs are clear, and breath sounds are equal bilaterally. Normal respiratory effort without pathologic use of accessory muscles. CARDIOVASCULAR: Heart is regular in rate and rhythm. GI: The abdomen is soft, nontender, and nondistended.  He has a mild rectus diastases limited to the mid epigastrium.  There were no palpable masses. I did not appreciate hepatosplenomegaly. There were normal bowel sounds. MUSCULOSKELETAL:  Symmetrical muscle tone appreciated in all four extremities.    SKIN: Skin  turgor is normal. No pathologic skin lesions appreciated.  NEUROLOGIC:  Motor and sensation appear grossly normal.  Cranial nerves are grossly without defect. PSYCH:  Alert and oriented to person, place and time. Affect is appropriate for situation.  Data Reviewed I have personally reviewed what is currently available of the patient's imaging, recent labs and medical records.   Labs:  CBC Latest Ref Rng & Units 10/25/2017 03/19/2016  WBC 3.8 - 10.6 K/uL 9.8 6.5  Hemoglobin 13.0 - 18.0 g/dL 15.2 16.7  Hematocrit 40.0 - 52.0 % 42.9 45.8  Platelets 150 - 440 K/uL 250 249   CMP  Latest Ref Rng & Units 04/21/2020 10/25/2017 03/19/2016  Glucose 70 - 99 mg/dL 111(H) 145(H) 110(H)  BUN 6 - 20 mg/dL 12 14 14   Creatinine 0.61 - 1.24 mg/dL 0.91 0.96 0.93  Sodium 135 - 145 mmol/L 136 134(L) 138  Potassium 3.5 - 5.1 mmol/L 3.8 3.8 4.4  Chloride 98 - 111 mmol/L 103 100(L) 106  CO2 22 - 32 mmol/L 24 26 25   Calcium 8.9 - 10.3 mg/dL 9.3 9.2 9.4  Total Protein 6.5 - 8.1 g/dL - 7.9 -  Total Bilirubin 0.3 - 1.2 mg/dL - 0.7 -  Alkaline Phos 38 - 126 U/L - 53 -  AST 15 - 41 U/L - 25 -  ALT 17 - 63 U/L - 25 -      Imaging: Radiology review: Last ultrasound showed uncomplicated cholelithiasis, this was in 2019.  He had a normal common bile duct diameter at that time. Within last 24 hrs: No results found.  Assessment    Chronic calculus cholecystitis. Patient Active Problem List   Diagnosis Date Noted  . Calculus of gallbladder without cholecystitis without obstruction 01/19/2018    Plan    Robotic cholecystectomy. We will repeat right upper quadrant ultrasound along with LFTs and CBC preop.  This was discussed thoroughly.  Optimal plan is for robotic cholecystectomy.  Risks and benefits have been discussed with the patient which include but are not limited to anesthesia, bleeding, infection, biliary ductal injury or stenosis, other associated unanticipated injuries affiliated with laparoscopic surgery.  I believe there is the desire to proceed, interpreter utilized as needed.  Questions elicited and answered to satisfaction.  No guarantees ever expressed or implied.   Face-to-face time spent with the patient and accompanying care providers(if present) was 24 minutes, with more than 50% of the time spent counseling, educating, and coordinating care of the patient.      [name] M.D., FACS 09/13/2020, 9:17 AM

## 2020-09-13 NOTE — Patient Instructions (Addendum)
Our surgery scheduler Pamala Hurry will call you within 24-48 to get you scheduled. If you have not heard from her after 48 hours, please call our office. You will need to get Covid tested before surgery and have the blue sheet available when she calls to write down important information. Your ultrasound is scheduled for September 27, 2020 at the [facility] on [address] @ 9:30am, nothing to eat or drink after midnight. Arrive at 9:15am. If you have any concerns or questions, please feel free to call our office.      Minimally Invasive Cholecystectomy, Care After This sheet gives you information about how to care for yourself after your procedure. Your doctor may also give you more specific instructions. If you have problems or questions, contact your doctor. What can I expect after the procedure? After the procedure, it is common:  To have pain at the areas of surgery. You will be given medicines for pain.  To vomit or feel like you may vomit.  To feel fullness in the belly (bloating) or to have pain in the shoulder. This comes from the gas that was used during the surgery. Follow these instructions at home: Medicines  Take over-the-counter and prescription medicines only as told by your doctor.  If you were prescribed an antibiotic medicine, take it as told by your doctor. Do not stop using the antibiotic even if you start to feel better.  Ask your doctor if the medicine prescribed to you: ? Requires you to avoid driving or using machinery. ? Can cause trouble pooping (constipation). You may need to take these actions to prevent or treat trouble pooping:  Drink enough fluid to keep your pee (urine) pale yellow.  Take over-the-counter or prescription medicines.  Eat foods that are high in fiber. These include beans, whole grains, and fresh fruits and vegetables.  Limit foods that are high in fat and sugar. These include fried or sweet foods. Incision care  Follow instructions  from your doctor about how to take care of your cuts from surgery (incisions). Make sure you: ? Wash your hands with soap and water for at least 20 seconds before and after you change your bandage (dressing). If you cannot use soap and water, use hand sanitizer. ? Change your bandage as told by your doctor. ? Leave stitches (sutures), skin glue, or skin tape (adhesive) strips in place. They may need to stay in place for 2 weeks or longer. If tape strips get loose and curl up, you may trim the loose edges. Do not remove tape strips completely unless your doctor says it is okay.  Do not take baths, swim, or use a hot tub until your doctor approves. Ask your doctor if you may take showers. You may only be allowed to take sponge baths.  Check your surgery area every day for signs of infection. Check for: ? More redness, swelling, or pain. ? Fluid or blood. ? Warmth. ? Pus or a bad smell.   Activity  Rest as told by your doctor.  Do not sit for a long time without moving. Get up to take short walks every 1-2 hours. This is important. Ask for help if you feel weak or unsteady.  Do not lift anything that is heavier than 10 lb (4.5 kg), or the limit that you are told, until your doctor says that it is safe.  Do not play contact sports until your doctor says it is okay.  Do not return to work or school until  your doctor says it is okay.  Return to your normal activities as told by your doctor. Ask your doctor what activities are safe for you. General instructions  If you were given a medicine to help you relax (sedative) during your procedure, it can affect you for many hours. Do not drive or use machinery until your doctor says that it is safe.  Keep all follow-up visits as told by your doctor. This is important. Contact a doctor if:  You get a rash.  You have more redness, swelling, or pain around your cuts from surgery.  You have fluid or blood coming from your cuts from  surgery.  Your cuts from surgery feel warm to the touch.  You have pus or a bad smell coming from your cuts from surgery.  You have a fever.  One or more of your cuts from surgery breaks open. Get help right away if:  You have trouble breathing.  You have chest pain.  You have pain that is getting worse in your shoulders.  You faint or feel dizzy when you stand.  You have very bad pain in your belly (abdomen).  You feel like you may vomit or you vomit, and this lasts for more than one day.  You have leg pain. Summary  After your surgery, it is common to have pain at the areas of surgery. You may also have vomiting or fullness in the belly.  Follow your doctor's instructions about medicine, activity restrictions, and caring for your surgery areas. Do not do activities that require a lot of effort.  Contact a doctor if you have a fever or other signs of infection, such as more redness, swelling, or pain around the cuts from surgery.  Get help right away if you have chest pain, increasing pain in the shoulders, or trouble breathing. This information is not intended to replace advice given to you by your health care provider. Make sure you discuss any questions you have with your health care provider. Document Revised: 06/14/2019 Document Reviewed: 06/14/2019 Elsevier Patient Education  2021 [employer].

## 2020-09-17 ENCOUNTER — Telehealth: Payer: Self-pay | Admitting: Surgery

## 2020-09-17 NOTE — Telephone Encounter (Signed)
Outgoing call is made, left message for patient to call.  Please advise patient of Pre-Admission date/time, COVID Testing date and Surgery date.  Surgery Date: 10/17/20 Preadmission Testing Date: 10/10/20 (phone 8a-1p) Covid Testing Date: 10/15/20 - patient advised to go to the [facility] ([place]) between 8a-1p  Also patient to call at [phone], between 1-3:00pm the day before surgery, to find out what time to arrive for surgery.

## 2020-09-17 NOTE — Telephone Encounter (Signed)
Incoming from patient, he is aware of all dates regarding his surgery and voices understanding.

## 2020-09-27 ENCOUNTER — Other Ambulatory Visit: Payer: Self-pay

## 2020-09-27 ENCOUNTER — Ambulatory Visit
Admission: RE | Admit: 2020-09-27 | Discharge: 2020-09-27 | Disposition: A | Discharge status: Home / Self Care | Payer: 59 | Source: Ambulatory Visit | Attending: Surgery | Admitting: Surgery

## 2020-09-27 DIAGNOSIS — K8000 Calculus of gallbladder with acute cholecystitis without obstruction: Secondary | ICD-10-CM | POA: Diagnosis present

## 2020-10-10 ENCOUNTER — Other Ambulatory Visit: Admission: RE | Admit: 2020-10-10 | Payer: 59 | Source: Ambulatory Visit

## 2020-10-11 ENCOUNTER — Other Ambulatory Visit: Payer: Self-pay

## 2020-10-11 ENCOUNTER — Encounter
Admission: RE | Admit: 2020-10-11 | Discharge: 2020-10-11 | Disposition: A | Discharge status: Home / Self Care | Payer: 59 | Source: Ambulatory Visit | Attending: Surgery | Admitting: Surgery

## 2020-10-11 HISTORY — DX: Unspecified osteoarthritis, unspecified site: M19.90

## 2020-10-11 HISTORY — DX: Other specified postprocedural states: R11.2

## 2020-10-11 HISTORY — DX: Gastro-esophageal reflux disease without esophagitis: K21.9

## 2020-10-11 HISTORY — DX: Other specified postprocedural states: Z98.890

## 2020-10-11 NOTE — Patient Instructions (Signed)
Your procedure is scheduled on:10-17-20 WEDNESDAY Report to the Registration Desk on the 1st floor of the [facility]-Then proceed to the 2nd floor Surgery Desk in the [facility] To find out your arrival time, please call [phone] between 1PM - 3PM on:10-16-20 TUESDAY  REMEMBER: Instructions that are not followed completely may result in serious medical risk, up to and including death; or upon the discretion of your surgeon and anesthesiologist your surgery may need to be rescheduled.  Do not eat food after midnight the night before surgery.  No gum chewing, lozengers or hard candies.  You may however, drink CLEAR liquids up to 2 hours before you are scheduled to arrive for your surgery. Do not drink anything within 2 hours of your scheduled arrival time.  Clear liquids include: - water  - apple juice without pulp - gatorade  - black coffee or tea (Do NOT add milk or creamers to the coffee or tea) Do NOT drink anything that is not on this list.  DO NOT TAKE ANY MEDICATION THE MORNING OF SURGERY  One week prior to surgery: Stop Anti-inflammatories (NSAIDS) such as MOBIC (MELOXICAM), Advil, Aleve, Ibuprofen, Motrin, Naproxen, Naprosyn and Aspirin based products such as Excedrin, Goodys Powder, BC Powder-OK TO TAKE TYLENOL IF NEEDED  Stop ANY OVER THE COUNTER supplements until after surgery.  No Alcohol for 24 hours before or after surgery.  No Smoking including e-cigarettes for 24 hours prior to surgery.  No chewable tobacco products for at least 6 hours prior to surgery.  No nicotine patches on the day of surgery.  Do not use any "recreational" drugs for at least a week prior to your surgery.  Please be advised that the combination of cocaine and anesthesia may have negative outcomes, up to and including death. If you test positive for cocaine, your surgery will be cancelled.  On the morning of surgery brush your teeth with toothpaste and water, you may rinse your mouth with  mouthwash if you wish. Do not swallow any toothpaste or mouthwash.  Do not wear jewelry, make-up, hairpins, clips or nail polish.  Do not wear lotions, powders, or perfumes.   Do not shave body from the neck down 48 hours prior to surgery just in case you cut yourself which could leave a site for infection.  Also, freshly shaved skin may become irritated if using the CHG soap.  Contact lenses, hearing aids and dentures may not be worn into surgery.  Do not bring valuables to the hospital. [facility] is not responsible for any missing/lost belongings or valuables.   Use CHG Soap as directed on instruction sheet.  Notify your doctor if there is any change in your medical condition (cold, fever, infection).  Wear comfortable clothing (specific to your surgery type) to the hospital.  Plan for stool softeners for home use; pain medications have a tendency to cause constipation. You can also help prevent constipation by eating foods high in fiber such as fruits and vegetables and drinking plenty of fluids as your diet allows.  After surgery, you can help prevent lung complications by doing breathing exercises.  Take deep breaths and cough every 1-2 hours. Your doctor may order a device called an Incentive Spirometer to help you take deep breaths. When coughing or sneezing, hold a pillow firmly against your incision with both hands. This is called "splinting." Doing this helps protect your incision. It also decreases belly discomfort.  If you are being admitted to the hospital overnight, leave your  suitcase in the car. After surgery it may be brought to your room.  If you are being discharged the day of surgery, you will not be allowed to drive home. You will need a responsible adult (18 years or older) to drive you home and stay with you that night.   If you are taking public transportation, you will need to have a responsible adult (18 years or older) with you. Please confirm with your  physician that it is acceptable to use public transportation.   Please call the [facility] at [phone] if you have any questions about these instructions.  Surgery Visitation Policy:  Patients undergoing a surgery or procedure may have one family member or support person with them as long as that person is not COVID-19 positive or experiencing its symptoms.  That person may remain in the waiting area during the procedure.  Inpatient Visitation:    Visiting hours are 7 a.m. to 8 p.m. Inpatients will be allowed two visitors daily. The visitors may change each day during the patient's stay. No visitors under the age of 28. Any visitor under the age of 85 must be accompanied by an adult. The visitor must pass COVID-19 screenings, use hand sanitizer when entering and exiting the patient's room and wear a mask at all times, including in the patient's room. Patients must also wear a mask when staff or their visitor are in the room. Masking is required regardless of vaccination status.

## 2020-10-12 HISTORY — PX: GALLBLADDER SURGERY: SHX652

## 2020-10-15 ENCOUNTER — Other Ambulatory Visit: Payer: Self-pay

## 2020-10-15 ENCOUNTER — Other Ambulatory Visit: Payer: 59

## 2020-10-15 ENCOUNTER — Encounter
Admission: RE | Admit: 2020-10-15 | Discharge: 2020-10-15 | Disposition: A | Discharge status: Home / Self Care | Payer: 59 | Source: Ambulatory Visit | Attending: Surgery | Admitting: Surgery

## 2020-10-15 DIAGNOSIS — Z20822 Contact with and (suspected) exposure to covid-19: Secondary | ICD-10-CM | POA: Insufficient documentation

## 2020-10-15 DIAGNOSIS — I10 Essential (primary) hypertension: Secondary | ICD-10-CM | POA: Insufficient documentation

## 2020-10-15 DIAGNOSIS — Z01818 Encounter for other preprocedural examination: Secondary | ICD-10-CM | POA: Diagnosis present

## 2020-10-15 LAB — CBC WITH DIFFERENTIAL/PLATELET
Abs Immature Granulocytes: 0.01 10*3/uL (ref 0.00–0.07)
Basophils Absolute: 0 10*3/uL (ref 0.0–0.1)
Basophils Relative: 1 %
Eosinophils Absolute: 0.7 10*3/uL — ABNORMAL HIGH (ref 0.0–0.5)
Eosinophils Relative: 12 %
HCT: 43.4 % (ref 39.0–52.0)
Hemoglobin: 15.1 g/dL (ref 13.0–17.0)
Immature Granulocytes: 0 %
Lymphocytes Relative: 28 %
Lymphs Abs: 1.6 10*3/uL (ref 0.7–4.0)
MCH: 29.1 pg (ref 26.0–34.0)
MCHC: 34.8 g/dL (ref 30.0–36.0)
MCV: 83.6 fL (ref 80.0–100.0)
Monocytes Absolute: 0.4 10*3/uL (ref 0.1–1.0)
Monocytes Relative: 6 %
Neutro Abs: 3.2 10*3/uL (ref 1.7–7.7)
Neutrophils Relative %: 53 %
Platelets: 221 10*3/uL (ref 150–400)
RBC: 5.19 MIL/uL (ref 4.22–5.81)
RDW: 12.7 % (ref 11.5–15.5)
WBC: 5.9 10*3/uL (ref 4.0–10.5)
nRBC: 0 % (ref 0.0–0.2)

## 2020-10-15 LAB — COMPREHENSIVE METABOLIC PANEL
ALT: 22 U/L (ref 0–44)
AST: 22 U/L (ref 15–41)
Albumin: 4 g/dL (ref 3.5–5.0)
Alkaline Phosphatase: 45 U/L (ref 38–126)
Anion gap: 4 — ABNORMAL LOW (ref 5–15)
BUN: 11 mg/dL (ref 6–20)
CO2: 26 mmol/L (ref 22–32)
Calcium: 8.9 mg/dL (ref 8.9–10.3)
Chloride: 108 mmol/L (ref 98–111)
Creatinine, Ser: 0.95 mg/dL (ref 0.61–1.24)
GFR, Estimated: >60 mL/min (ref >60)
Glucose, Bld: 114 mg/dL — ABNORMAL HIGH (ref 70–99)
Potassium: 3.9 mmol/L (ref 3.5–5.1)
Sodium: 138 mmol/L (ref 135–145)
Total Bilirubin: 0.8 mg/dL (ref 0.3–1.2)
Total Protein: 7.4 g/dL (ref 6.5–8.1)

## 2020-10-15 LAB — SARS CORONAVIRUS 2 (TAT 6-24 HRS): SARS Coronavirus 2: NEGATIVE

## 2020-10-17 ENCOUNTER — Ambulatory Visit
Admission: RE | Admit: 2020-10-17 | Discharge: 2020-10-17 | Disposition: A | Discharge status: Home / Self Care | Payer: 59 | Source: Ambulatory Visit | Attending: Surgery | Admitting: Surgery

## 2020-10-17 ENCOUNTER — Other Ambulatory Visit: Payer: Self-pay

## 2020-10-17 ENCOUNTER — Ambulatory Visit: Payer: 59 | Admitting: Anesthesiology

## 2020-10-17 ENCOUNTER — Encounter: Payer: Self-pay | Admitting: Surgery

## 2020-10-17 ENCOUNTER — Encounter
Admission: RE | Disposition: A | Discharge status: Home / Self Care | Payer: Self-pay | Source: Ambulatory Visit | Attending: Surgery

## 2020-10-17 DIAGNOSIS — K8010 Calculus of gallbladder with chronic cholecystitis without obstruction: Secondary | ICD-10-CM | POA: Insufficient documentation

## 2020-10-17 DIAGNOSIS — Z791 Long term (current) use of non-steroidal anti-inflammatories (NSAID): Secondary | ICD-10-CM | POA: Diagnosis not present

## 2020-10-17 DIAGNOSIS — Z888 Allergy status to other drugs, medicaments and biological substances status: Secondary | ICD-10-CM | POA: Insufficient documentation

## 2020-10-17 LAB — URINE DRUG SCREEN, QUALITATIVE (ARMC ONLY)
Amphetamines, Ur Screen: NOT DETECTED
Barbiturates, Ur Screen: NOT DETECTED
Benzodiazepine, Ur Scrn: NOT DETECTED
Cannabinoid 50 Ng, Ur ~~LOC~~: POSITIVE — AB
Cocaine Metabolite,Ur ~~LOC~~: NOT DETECTED
MDMA (Ecstasy)Ur Screen: NOT DETECTED
Methadone Scn, Ur: NOT DETECTED
Opiate, Ur Screen: NOT DETECTED
Phencyclidine (PCP) Ur S: NOT DETECTED
Tricyclic, Ur Screen: NOT DETECTED

## 2020-10-17 SURGERY — CHOLECYSTECTOMY, ROBOT-ASSISTED, LAPAROSCOPIC
Anesthesia: General

## 2020-10-17 MED ORDER — DEXAMETHASONE SODIUM PHOSPHATE 10 MG/ML IJ SOLN
INTRAMUSCULAR | Status: AC
  Filled 2020-10-17: qty 1

## 2020-10-17 MED ORDER — LIDOCAINE HCL (CARDIAC) PF 100 MG/5ML IV SOSY
PREFILLED_SYRINGE | INTRAVENOUS | Status: DC | PRN
  Administered 2020-10-17: 100 mg via INTRAVENOUS

## 2020-10-17 MED ORDER — OXYCODONE HCL 5 MG PO TABS
ORAL_TABLET | ORAL | Status: AC
  Filled 2020-10-17: qty 1

## 2020-10-17 MED ORDER — INDOCYANINE GREEN 25 MG IV SOLR
2.5000 mg | Freq: Once | INTRAVENOUS | Status: AC
  Administered 2020-10-17: 2.5 mg via INTRAVENOUS
  Filled 2020-10-17: qty 1

## 2020-10-17 MED ORDER — PROPOFOL 10 MG/ML IV BOLUS
INTRAVENOUS | Status: AC
  Filled 2020-10-17: qty 20

## 2020-10-17 MED ORDER — CHLORHEXIDINE GLUCONATE CLOTH 2 % EX PADS: 6.0000 | MEDICATED_PAD | Freq: Once | CUTANEOUS | Status: DC

## 2020-10-17 MED ORDER — LABETALOL HCL 5 MG/ML IV SOLN
INTRAVENOUS | Status: DC | PRN
  Administered 2020-10-17: 5 mg via INTRAVENOUS

## 2020-10-17 MED ORDER — MIDAZOLAM HCL 2 MG/2ML IJ SOLN
INTRAMUSCULAR | Status: DC | PRN
  Administered 2020-10-17: 2 mg via INTRAVENOUS

## 2020-10-17 MED ORDER — OXYCODONE HCL 5 MG PO TABS
5.0000 mg | ORAL_TABLET | Freq: Once | ORAL | Status: AC | PRN
  Administered 2020-10-17: 5 mg via ORAL

## 2020-10-17 MED ORDER — FENTANYL CITRATE (PF) 100 MCG/2ML IJ SOLN
INTRAMUSCULAR | Status: AC
  Filled 2020-10-17: qty 2

## 2020-10-17 MED ORDER — ORAL CARE MOUTH RINSE: 15.0000 mL | Freq: Once | OROMUCOSAL | Status: DC

## 2020-10-17 MED ORDER — DEXMEDETOMIDINE (PRECEDEX) IN NS 20 MCG/5ML (4 MCG/ML) IV SYRINGE
PREFILLED_SYRINGE | INTRAVENOUS | Status: DC | PRN
  Administered 2020-10-17: 8 ug via INTRAVENOUS

## 2020-10-17 MED ORDER — CEFAZOLIN SODIUM-DEXTROSE 2-4 GM/100ML-% IV SOLN
2.0000 g | INTRAVENOUS | Status: AC
  Administered 2020-10-17: 2 g via INTRAVENOUS

## 2020-10-17 MED ORDER — ROCURONIUM BROMIDE 10 MG/ML (PF) SYRINGE
PREFILLED_SYRINGE | INTRAVENOUS | Status: AC
  Filled 2020-10-17: qty 10

## 2020-10-17 MED ORDER — PHENYLEPHRINE HCL (PRESSORS) 10 MG/ML IV SOLN
INTRAVENOUS | Status: AC
  Filled 2020-10-17: qty 1

## 2020-10-17 MED ORDER — FAMOTIDINE 20 MG PO TABS: 20.0000 mg | ORAL_TABLET | Freq: Once | ORAL | Status: AC

## 2020-10-17 MED ORDER — BUPIVACAINE LIPOSOME 1.3 % IJ SUSP
INTRAMUSCULAR | Status: AC
  Filled 2020-10-17: qty 20

## 2020-10-17 MED ORDER — EPHEDRINE SULFATE 50 MG/ML IJ SOLN
INTRAMUSCULAR | Status: DC | PRN
  Administered 2020-10-17: 15 mg via INTRAVENOUS

## 2020-10-17 MED ORDER — FENTANYL CITRATE (PF) 100 MCG/2ML IJ SOLN
25.0000 ug | INTRAMUSCULAR | Status: DC | PRN
  Administered 2020-10-17: 25 ug via INTRAVENOUS

## 2020-10-17 MED ORDER — ONDANSETRON HCL 4 MG/2ML IJ SOLN
INTRAMUSCULAR | Status: AC
  Filled 2020-10-17: qty 2

## 2020-10-17 MED ORDER — ACETAMINOPHEN 500 MG PO TABS
ORAL_TABLET | ORAL | Status: AC
  Administered 2020-10-17: 1000 mg via ORAL
  Filled 2020-10-17: qty 2

## 2020-10-17 MED ORDER — OXYCODONE HCL 5 MG PO TABS: 5.0000 mg | ORAL_TABLET | Freq: Four times a day (QID) | ORAL | 0 refills | Status: DC | PRN

## 2020-10-17 MED ORDER — SUGAMMADEX SODIUM 500 MG/5ML IV SOLN
INTRAVENOUS | Status: DC | PRN
  Administered 2020-10-17: 210 mg via INTRAVENOUS

## 2020-10-17 MED ORDER — EPHEDRINE 5 MG/ML INJ
INTRAVENOUS | Status: AC
  Filled 2020-10-17: qty 10

## 2020-10-17 MED ORDER — LIDOCAINE HCL (PF) 2 % IJ SOLN
INTRAMUSCULAR | Status: AC
  Filled 2020-10-17: qty 5

## 2020-10-17 MED ORDER — GABAPENTIN 300 MG PO CAPS
ORAL_CAPSULE | ORAL | Status: AC
  Administered 2020-10-17: 300 mg via ORAL
  Filled 2020-10-17: qty 1

## 2020-10-17 MED ORDER — BUPIVACAINE-EPINEPHRINE (PF) 0.25% -1:200000 IJ SOLN
INTRAMUSCULAR | Status: DC | PRN
  Administered 2020-10-17: 26 mL

## 2020-10-17 MED ORDER — DEXAMETHASONE SODIUM PHOSPHATE 10 MG/ML IJ SOLN
INTRAMUSCULAR | Status: DC | PRN
  Administered 2020-10-17: 10 mg via INTRAVENOUS

## 2020-10-17 MED ORDER — OXYCODONE HCL 5 MG PO TABS: 5.0000 mg | ORAL_TABLET | Freq: Once | ORAL | Status: AC

## 2020-10-17 MED ORDER — CHLORHEXIDINE GLUCONATE 0.12 % MT SOLN: 15.0000 mL | Freq: Once | OROMUCOSAL | Status: DC

## 2020-10-17 MED ORDER — OXYCODONE HCL 5 MG PO TABS
ORAL_TABLET | ORAL | Status: AC
  Administered 2020-10-17: 5 mg via ORAL
  Filled 2020-10-17: qty 1

## 2020-10-17 MED ORDER — FAMOTIDINE 20 MG PO TABS
ORAL_TABLET | ORAL | Status: AC
  Administered 2020-10-17: 20 mg via ORAL
  Filled 2020-10-17: qty 1

## 2020-10-17 MED ORDER — CEFAZOLIN SODIUM-DEXTROSE 2-4 GM/100ML-% IV SOLN
INTRAVENOUS | Status: AC
  Filled 2020-10-17: qty 100

## 2020-10-17 MED ORDER — LACTATED RINGERS IV SOLN: INTRAVENOUS | Status: DC

## 2020-10-17 MED ORDER — FENTANYL CITRATE (PF) 100 MCG/2ML IJ SOLN
INTRAMUSCULAR | Status: DC | PRN
  Administered 2020-10-17: 100 ug via INTRAVENOUS
  Administered 2020-10-17: 50 ug via INTRAVENOUS

## 2020-10-17 MED ORDER — CELECOXIB 200 MG PO CAPS
ORAL_CAPSULE | ORAL | Status: AC
  Administered 2020-10-17: 200 mg via ORAL
  Filled 2020-10-17: qty 1

## 2020-10-17 MED ORDER — ROCURONIUM BROMIDE 100 MG/10ML IV SOLN
INTRAVENOUS | Status: DC | PRN
  Administered 2020-10-17: 60 mg via INTRAVENOUS

## 2020-10-17 MED ORDER — FENTANYL CITRATE (PF) 100 MCG/2ML IJ SOLN
INTRAMUSCULAR | Status: AC
  Administered 2020-10-17: 25 ug via INTRAVENOUS
  Filled 2020-10-17: qty 2

## 2020-10-17 MED ORDER — DEXMEDETOMIDINE (PRECEDEX) IN NS 20 MCG/5ML (4 MCG/ML) IV SYRINGE
PREFILLED_SYRINGE | INTRAVENOUS | Status: AC
  Filled 2020-10-17: qty 5

## 2020-10-17 MED ORDER — OXYCODONE HCL 5 MG/5ML PO SOLN
5.0000 mg | Freq: Once | ORAL | Status: AC | PRN
Start: 2020-10-17 — End: 2020-10-17

## 2020-10-17 MED ORDER — BUPIVACAINE LIPOSOME 1.3 % IJ SUSP: 20.0000 mL | Freq: Once | INTRAMUSCULAR | Status: DC

## 2020-10-17 MED ORDER — ONDANSETRON HCL 4 MG PO TABS: 4.0000 mg | ORAL_TABLET | Freq: Every day | ORAL | 1 refills | Status: AC | PRN

## 2020-10-17 MED ORDER — BUPIVACAINE-EPINEPHRINE (PF) 0.25% -1:200000 IJ SOLN
INTRAMUSCULAR | Status: AC
  Filled 2020-10-17: qty 30

## 2020-10-17 MED ORDER — SUGAMMADEX SODIUM 500 MG/5ML IV SOLN
INTRAVENOUS | Status: AC
  Filled 2020-10-17: qty 5

## 2020-10-17 MED ORDER — LABETALOL HCL 5 MG/ML IV SOLN
INTRAVENOUS | Status: AC
  Filled 2020-10-17: qty 4

## 2020-10-17 MED ORDER — GABAPENTIN 300 MG PO CAPS: 300.0000 mg | ORAL_CAPSULE | ORAL | Status: AC

## 2020-10-17 MED ORDER — IBUPROFEN 800 MG PO TABS: 800.0000 mg | ORAL_TABLET | Freq: Three times a day (TID) | ORAL | 0 refills | Status: DC | PRN

## 2020-10-17 MED ORDER — MIDAZOLAM HCL 2 MG/2ML IJ SOLN
INTRAMUSCULAR | Status: AC
  Filled 2020-10-17: qty 2

## 2020-10-17 MED ORDER — PROPOFOL 10 MG/ML IV BOLUS
INTRAVENOUS | Status: DC | PRN
  Administered 2020-10-17: 150 mg via INTRAVENOUS

## 2020-10-17 MED ORDER — CELECOXIB 200 MG PO CAPS: 200.0000 mg | ORAL_CAPSULE | ORAL | Status: AC

## 2020-10-17 MED ORDER — ONDANSETRON HCL 4 MG/2ML IJ SOLN
INTRAMUSCULAR | Status: DC | PRN
  Administered 2020-10-17: 4 mg via INTRAVENOUS

## 2020-10-17 MED ORDER — ONDANSETRON HCL 4 MG/2ML IJ SOLN
4.0000 mg | Freq: Once | INTRAMUSCULAR | Status: AC | PRN
  Administered 2020-10-17: 4 mg via INTRAVENOUS

## 2020-10-17 MED ORDER — ACETAMINOPHEN 500 MG PO TABS: 1000.0000 mg | ORAL_TABLET | ORAL | Status: AC

## 2020-10-17 MED ORDER — SCOPOLAMINE 1 MG/3DAYS TD PT72
MEDICATED_PATCH | TRANSDERMAL | Status: AC
  Administered 2020-10-17: 1.5 mg
  Filled 2020-10-17: qty 1

## 2020-10-17 SURGICAL SUPPLY — 49 items
ADH SKN CLS APL DERMABOND .7 (GAUZE/BANDAGES/DRESSINGS) ×1
APL PRP STRL LF DISP 70% ISPRP (MISCELLANEOUS) ×1
BAG SPEC RTRVL LRG 6X4 10 (ENDOMECHANICALS) ×1
CANISTER SUCT 1200ML W/VALVE (MISCELLANEOUS) IMPLANT
CANNULA CAP OBTURATR AIRSEAL 8 (CAP) ×2 IMPLANT
CHLORAPREP W/TINT 26 (MISCELLANEOUS) ×2 IMPLANT
CLIP VESOLOCK LG 6/CT PURPLE (CLIP) ×2 IMPLANT
COVER TIP SHEARS 8 DVNC (MISCELLANEOUS) ×1 IMPLANT
COVER TIP SHEARS 8MM DA VINCI (MISCELLANEOUS) ×1
COVER WAND RF STERILE (DRAPES) ×2 IMPLANT
DECANTER SPIKE VIAL GLASS SM (MISCELLANEOUS) ×2 IMPLANT
DEFOGGER SCOPE WARMER CLEARIFY (MISCELLANEOUS) ×2 IMPLANT
DERMABOND ADVANCED (GAUZE/BANDAGES/DRESSINGS) ×1
DERMABOND ADVANCED .7 DNX12 (GAUZE/BANDAGES/DRESSINGS) ×1 IMPLANT
DRAPE ARM DVNC X/XI (DISPOSABLE) ×4 IMPLANT
DRAPE COLUMN DVNC XI (DISPOSABLE) ×1 IMPLANT
DRAPE DA VINCI XI ARM (DISPOSABLE) ×4
DRAPE DA VINCI XI COLUMN (DISPOSABLE) ×1
ELECT CAUTERY BLADE 6.4 (BLADE) ×2 IMPLANT
GLOVE ORTHO TXT STRL SZ7.5 (GLOVE) ×4 IMPLANT
GOWN STRL REUS W/ TWL LRG LVL3 (GOWN DISPOSABLE) ×4 IMPLANT
GOWN STRL REUS W/TWL LRG LVL3 (GOWN DISPOSABLE) ×8
GRASPER SUT TROCAR 14GX15 (MISCELLANEOUS) IMPLANT
INFUSOR MANOMETER BAG 3000ML (MISCELLANEOUS) IMPLANT
IRRIGATION STRYKERFLOW (MISCELLANEOUS) IMPLANT
IRRIGATOR STRYKERFLOW (MISCELLANEOUS)
IRRIGATOR SUCT 8 DISP DVNC XI (IRRIGATION / IRRIGATOR) IMPLANT
IRRIGATOR SUCTION 8MM XI DISP (IRRIGATION / IRRIGATOR)
IV NS IRRIG 3000ML ARTHROMATIC (IV SOLUTION) IMPLANT
KIT PINK PAD W/HEAD ARE REST (MISCELLANEOUS) ×2
KIT PINK PAD W/HEAD ARM REST (MISCELLANEOUS) ×1 IMPLANT
KIT TURNOVER KIT A (KITS) ×2 IMPLANT
LABEL OR SOLS (LABEL) ×2 IMPLANT
MANIFOLD NEPTUNE II (INSTRUMENTS) ×2 IMPLANT
NEEDLE HYPO 22GX1.5 SAFETY (NEEDLE) ×2 IMPLANT
NEEDLE INSUFFLATION 14GA 120MM (NEEDLE) IMPLANT
NS IRRIG 500ML POUR BTL (IV SOLUTION) ×2 IMPLANT
PACK LAP CHOLECYSTECTOMY (MISCELLANEOUS) ×2 IMPLANT
PENCIL ELECTRO HAND CTR (MISCELLANEOUS) ×2 IMPLANT
POUCH SPECIMEN RETRIEVAL 10MM (ENDOMECHANICALS) ×2 IMPLANT
SEAL CANN UNIV 5-8 DVNC XI (MISCELLANEOUS) ×3 IMPLANT
SEAL XI 5MM-8MM UNIVERSAL (MISCELLANEOUS) ×3
SET TUBE FILTERED XL AIRSEAL (SET/KITS/TRAYS/PACK) ×2 IMPLANT
SOLUTION ELECTROLUBE (MISCELLANEOUS) ×2 IMPLANT
SUT MNCRL 4-0 (SUTURE) ×2
SUT MNCRL 4-0 27XMFL (SUTURE) ×1
SUT VICRYL 0 AB UR-6 (SUTURE) ×2 IMPLANT
SUTURE MNCRL 4-0 27XMF (SUTURE) ×1 IMPLANT
TROCAR Z-THREAD FIOS 11X100 BL (TROCAR) ×2 IMPLANT

## 2020-10-17 NOTE — Anesthesia Postprocedure Evaluation (Signed)
Anesthesia Post Note  Patient: Ernest Boyd  Procedure(s) Performed: XI ROBOTIC ASSISTED LAPAROSCOPIC CHOLECYSTECTOMY (N/A )  Patient location during evaluation: PACU Anesthesia Type: General Level of consciousness: awake and alert Pain management: pain level controlled Vital Signs Assessment: post-procedure vital signs reviewed and stable Respiratory status: spontaneous breathing, nonlabored ventilation, respiratory function stable and patient connected to nasal cannula oxygen Cardiovascular status: blood pressure returned to baseline and stable Postop Assessment: no apparent nausea or vomiting Anesthetic complications: no   No complications documented.   Last Vitals:  Vitals:   10/17/20 1121 10/17/20 1133  BP:  (!) 161/106  Pulse: 65 69  Resp: 13 18  Temp:  (!) 36.2 C  SpO2: 95% 97%    Last Pain:  Vitals:   10/17/20 1133  TempSrc: Oral  PainSc: 4                  Arita Miss

## 2020-10-17 NOTE — Transfer of Care (Signed)
Immediate Anesthesia Transfer of Care Note  Patient: Ernest Boyd  Procedure(s) Performed: XI ROBOTIC ASSISTED LAPAROSCOPIC CHOLECYSTECTOMY (N/A )  Patient Location: PACU  Anesthesia Type:General  Level of Consciousness: drowsy and patient cooperative  Airway & Oxygen Therapy: Patient Spontanous Breathing and Patient connected to face mask oxygen  Post-op Assessment: Report given to RN and Post -op Vital signs reviewed and stable  Post vital signs: Reviewed and stable  Last Vitals:  Vitals Value Taken Time  BP 165/118 10/17/20 1023  Temp 36.1 C 10/17/20 1023  Pulse 76 10/17/20 1025  Resp 18 10/17/20 1025  SpO2 97 % 10/17/20 1025  Vitals shown include unvalidated device data.  Last Pain:  Vitals:   10/17/20 1023  TempSrc:   PainSc: Asleep         Complications: No complications documented.

## 2020-10-17 NOTE — Discharge Instructions (Signed)
AMBULATORY SURGERY  DISCHARGE INSTRUCTIONS   1) The drugs that you were given will stay in your system until tomorrow so for the next 24 hours you should not:  A) Drive an automobile B) Make any legal decisions C) Drink any alcoholic beverage   2) You may resume regular meals tomorrow.  Today it is better to start with liquids and gradually work up to solid foods.  You may eat anything you prefer, but it is better to start with liquids, then soup and crackers, and gradually work up to solid foods.   3) Please notify your doctor immediately if you have any unusual bleeding, trouble breathing, redness and pain at the surgery site, drainage, fever, or pain not relieved by medication.    4) Additional Instructions: AMBULATORY SURGERY  DISCHARGE INSTRUCTIONS   5) The drugs that you were given will stay in your system until tomorrow so for the next 24 hours you should not:  D) Drive an automobile E) Make any legal decisions F) Drink any alcoholic beverage   6) You may resume regular meals tomorrow.  Today it is better to start with liquids and gradually work up to solid foods.  You may eat anything you prefer, but it is better to start with liquids, then soup and crackers, and gradually work up to solid foods.   7) Please notify your doctor immediately if you have any unusual bleeding, trouble breathing, redness and pain at the surgery site, drainage, fever, or pain not relieved by medication.    8) Additional Instructions:        Please contact your physician with any problems or Same Day Surgery at [phone], Monday through Friday 6 am to 4 pm, or [place] at [facility] number at [phone].  Please contact your physician with any problems or Same Day Surgery at [phone], Monday through Friday 6 am to 4 pm, or [place] at [facility] number at [phone].

## 2020-10-17 NOTE — Interval H&P Note (Signed)
History and Physical Interval Note:  10/17/2020 8:21 AM  Ernest Boyd  has presented today for surgery, with the diagnosis of chronic calculous cholecystitis.  The various methods of treatment have been discussed with the patient and family. After consideration of risks, benefits and other options for treatment, the patient has consented to  Procedure(s): XI ROBOTIC [place] (N/A) as a surgical intervention.  The patient's history has been reviewed, patient examined, no change in status, stable for surgery.  I have reviewed the patient's chart and labs.  Questions were answered to the patient's satisfaction.     [name]

## 2020-10-17 NOTE — Op Note (Signed)
Robotic cholecystectomy  Pre-operative Diagnosis: Chronic calculus cholecystitis  Post-operative Diagnosis:  Same.  Procedure: Robotic assisted laparoscopic cholecystectomy.  Surgeon: [name], M.D., FACS  Anesthesia: General. with endotracheal tube  Findings: As expected, adhesions and adhesions to the duodenum.  Estimated Blood Loss: 10 mL         Drains: None         Specimens: Gallbladder           Complications: none  Procedure Details  The patient was seen again in the Holding Room.  1.25-2.5 mg dose of ICG was administered intravenously.  The benefits, complications, treatment options, risks and expected outcomes were discussed with the patient. The likelihood of improving the patient's symptoms with return to their baseline status is good.  The patient and/or family concurred with the proposed plan, giving informed consent, again alternatives reviewed.  The patient was taken to Operating Room, identified, and the procedure verified as robotic assisted laparoscopic cholecystectomy.  Prior to the induction of general anesthesia, antibiotic prophylaxis was administered. VTE prophylaxis was in place. General endotracheal anesthesia was then administered and tolerated well. The patient was positioned in the supine position.  After the induction, the abdomen was prepped with Chloraprep and draped in the sterile fashion.  A Time Out was held and the above information confirmed. After local infiltration of quarter percent Marcaine with epinephrine, stab incision was made left upper quadrant.  Just below the costal margin approximately midclavicular line the Veres needle is passed with sensation of the layers to penetrate the abdominal wall and into the peritoneum.  Saline drop test is confirmed peritoneal placement.  Insufflation is initiated with carbon dioxide to pressures of 15 mmHg.  Air seal utilized for insufflation.  Right infra-umbilical local infiltration with quarter  percent Marcaine with epinephrine is utilized.  Made a 12 mm incision on the left periumbilical site, I advanced an optical 47m port under direct visualization into the peritoneal cavity.  Once the peritoneum was penetrated, insufflation was transferred.  The trocar was then advanced into the abdominal cavity under direct visualization. Pneumoperitoneum was then continued with CO2 at 14 mmHg or less and tolerated well without any adverse changes in the patient's vital signs.  Two 8.5-mm ports were placed in the left lower quadrant and laterally, and one to the right lower quadrant, all under direct vision. All skin incisions  were infiltrated with a local anesthetic agent before making the incision and placing the trocars.  The patient was positioned  in reverse Trendelenburg, tilted the patient's left side down.  Da Vinci XI robot was then positioned on to the patient's left side, and docked.  The gallbladder was identified, the fundus grasped via the arm 4 Prograsp and retracted cephalad. Adhesions were lysed with scissors and cautery.  The infundibulum was identified grasped and retracted laterally, exposing the peritoneum overlying the triangle of Calot. This was then opened and dissected using cautery & scissors. An extended critical view of the cystic duct and cystic artery was obtained, aided by the ICG via FireFly which enabled ready visualization of the ductal anatomy.    The cystic duct was clearly identified and dissected to isolation.   And the cystic duct was triple clipped and divided with scissors, leaving two on the remaining stump.  The specimen side of the artery is sealed with bipolar and divided with monopolar scissors.   The gallbladder was taken from the gallbladder fossa in a retrograde fashion with the electrocautery. The gallbladder was removed and  placed in an Endocatch bag.   The liver bed is inspected. Hemostasis was confirmed.  The robot was undocked and moved away from the  operative field. No irrigation was utilized.  The gallbladder and Endocatch sac were then removed through the infraumbilical port site.    Inspection of the right upper quadrant was performed. No bleeding, bile duct injury or leak, or bowel injury was noted. The infra-umbilical port site fascia was closed with interrumpted 0 Vicryl sutures using PMI/cone under direct visualization. Pneumoperitoneum was released and ports removed.  4-0 subcuticular Monocryl was used to close the skin. Dermabond was  applied.  The patient was then extubated and brought to the recovery room in stable condition. Sponge, lap, and needle counts were correct at closure and at the conclusion of the case.               [name], M.D., [facility] 10/17/2020 10:13 AM
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
## 2020-10-17 NOTE — H&P (Signed)
Chief Complaint: Right upper quadrant pain  History of Present Illness Ernest Boyd is a 50 y.o. male with a well-known history of cholelithiasis, diagnosed in 2019.  He was able to be essentially symptom-free for about a year and a half being extremely careful with his diet.  Unfortunately due to travel, he was eating out a lot and had a severe flareup in flight to [place].  He has had associated abdominal bloating, radiation to the back.  He denies nausea, vomiting, fevers or chills currently.  Reports regular bowel movements and denies acholic movements.  He denies any history of jaundice.  His last flareup was approximately 2 weeks ago that lasted 4 to 5 hours.  He waited in the ER and when the pain resolved he did not wait any longer.  Past Medical History     Past Medical History:  Diagnosis Date  . 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 Hyperlipidemia   . Hypertension   . NASH (nonalcoholic steatohepatitis)            Past Surgical History:  Procedure Laterality Date  . TONSILLECTOMY          Allergies  Allergen Reactions  . Dilantin [Phenytoin] Rash          Current Outpatient Medications  Medication Sig Dispense Refill  . lisinopril (PRINIVIL,ZESTRIL) 10 MG tablet     . meloxicam (MOBIC) 15 MG tablet Take 1 tablet (15 mg total) by mouth daily. 30 tablet 1  . simvastatin (ZOCOR) 40 MG tablet      No current facility-administered medications for this visit.    Family History      Family History  Problem Relation Age of Onset  . Hypertension Father       Social History Social History       Tobacco Use  . Smoking status: Never Smoker  . Smokeless tobacco: Never Used  Vaping Use  . Vaping Use: Never used  Substance Use Topics  . Alcohol use: No  . Drug use: Not Currently        Review of Systems  Constitutional: Negative.   HENT: Negative.   Eyes: Negative.   Respiratory: Negative.   Cardiovascular: Negative.   Gastrointestinal:  Positive for abdominal pain. Negative for constipation, heartburn, nausea and vomiting.  Genitourinary: Negative.   Skin: Negative.   Neurological: Negative.   Psychiatric/Behavioral: Negative.       Physical Exam Blood pressure (!) 179/120, pulse 67, temperature 98.5 F (36.9 C), temperature source Oral, height 5' 11"  (1.803 m), weight 246 lb 9.6 oz (111.9 kg), SpO2 97 %.          Last Weight  Most recent update: 09/13/2020  8:57 AM           Weight  111.9 kg (246 lb 9.6 oz)             CONSTITUTIONAL: Well developed, and nourished, appropriately responsive and aware without distress.   EYES: Sclera non-icteric.   EARS, NOSE, MOUTH AND THROAT: Mask worn.  Hearing is intact to voice.  NECK: Trachea is midline, and there is no jugular venous distension.  LYMPH NODES:  Lymph nodes in the neck are not enlarged. RESPIRATORY:  Lungs are clear, and breath sounds are equal bilaterally. Normal respiratory effort without pathologic use of accessory muscles. CARDIOVASCULAR: Heart is regular in rate and rhythm. GI: The abdomen is soft, nontender, and nondistended.  He has a mild rectus diastases limited to the mid epigastrium.  There  were no palpable masses. I did not appreciate hepatosplenomegaly. There were normal bowel sounds. MUSCULOSKELETAL:  Symmetrical muscle tone appreciated in all four extremities.    SKIN: Skin turgor is normal. No pathologic skin lesions appreciated.  NEUROLOGIC:  Motor and sensation appear grossly normal.  Cranial nerves are grossly without defect. PSYCH:  Alert and oriented to person, place and time. Affect is appropriate for situation.  Data Reviewed I have personally reviewed what is currently available of the patient's imaging, recent labs and medical records.   Labs:  CBC Latest Ref Rng & Units 10/25/2017 03/19/2016  WBC 3.8 - 10.6 K/uL 9.8 6.5  Hemoglobin 13.0 - 18.0 g/dL 15.2 16.7  Hematocrit 40.0 - 52.0 % 42.9 45.8  Platelets 150 - 440 K/uL 250  249   CMP Latest Ref Rng & Units 04/21/2020 10/25/2017 03/19/2016  Glucose 70 - 99 mg/dL 111(H) 145(H) 110(H)  BUN 6 - 20 mg/dL 12 14 14   Creatinine 0.61 - 1.24 mg/dL 0.91 0.96 0.93  Sodium 135 - 145 mmol/L 136 134(L) 138  Potassium 3.5 - 5.1 mmol/L 3.8 3.8 4.4  Chloride 98 - 111 mmol/L 103 100(L) 106  CO2 22 - 32 mmol/L 24 26 25   Calcium 8.9 - 10.3 mg/dL 9.3 9.2 9.4  Total Protein 6.5 - 8.1 g/dL - 7.9 -  Total Bilirubin 0.3 - 1.2 mg/dL - 0.7 -  Alkaline Phos 38 - 126 U/L - 53 -  AST 15 - 41 U/L - 25 -  ALT 17 - 63 U/L - 25 -      Imaging: Radiology review: Last ultrasound showed uncomplicated cholelithiasis, this was in 2019.  He had a normal common bile duct diameter at that time. Within last 24 hrs: No results found.  Assessment  Assessment    Chronic calculus cholecystitis.     Patient Active Problem List   Diagnosis Date Noted  . Calculus of gallbladder without cholecystitis without obstruction 01/19/2018    Plan   Plan    Robotic cholecystectomy. We will repeat right upper quadrant ultrasound along with LFTs and CBC preop.  This was discussed thoroughly.  Optimal plan is for robotic cholecystectomy.  Risks and benefits have been discussed with the patient which include but are not limited to anesthesia, bleeding, infection, biliary ductal injury or stenosis, other associated unanticipated injuries affiliated with laparoscopic surgery.  I believe there is the desire to proceed, interpreter utilized as needed.  Questions elicited and answered to satisfaction.  No guarantees ever expressed or implied.   Face-to-face time spent with the patient and accompanying care providers(if present) was 24 minutes, with more than 50% of the time spent counseling, educating, and coordinating care of the patient.      [name] M.D., FACS 09/13/2020, 9:17 AM

## 2020-10-17 NOTE — Anesthesia Procedure Notes (Signed)
Procedure Name: Intubation Date/Time: 10/17/2020 8:48 AM Performed by: Jonna Clark, CRNA Pre-anesthesia Checklist: Patient identified, Patient being monitored, Timeout performed, Emergency Drugs available and Suction available Patient Re-evaluated:Patient Re-evaluated prior to induction Oxygen Delivery Method: Circle system utilized Preoxygenation: Pre-oxygenation with 100% oxygen Induction Type: IV induction Ventilation: Mask ventilation without difficulty Laryngoscope Size: 4 and McGraph Grade View: Grade I Tube type: Oral Tube size: 7.5 mm Number of attempts: 1 Airway Equipment and Method: Stylet Placement Confirmation: ETT inserted through vocal cords under direct vision,  positive ETCO2 and breath sounds checked- equal and bilateral Secured at: 22 cm Tube secured with: Tape Dental Injury: Teeth and Oropharynx as per pre-operative assessment

## 2020-10-18 LAB — SURGICAL PATHOLOGY

## 2020-10-22 ENCOUNTER — Telehealth: Payer: Self-pay | Admitting: Surgery

## 2020-10-22 NOTE — Telephone Encounter (Signed)
Patient is calling and is complaining of his gums hurting and tooth believes it was from when he went into surgery and they had put the tube down his throat patient is asking what he could do about this or should he come in and have someone take a look at it. Please call patient and advise.

## 2020-10-24 ENCOUNTER — Other Ambulatory Visit: Payer: Self-pay | Admitting: Surgery

## 2020-11-01 ENCOUNTER — Ambulatory Visit (INDEPENDENT_AMBULATORY_CARE_PROVIDER_SITE_OTHER): Payer: 59 | Admitting: Surgery

## 2020-11-01 ENCOUNTER — Other Ambulatory Visit: Payer: Self-pay

## 2020-11-01 ENCOUNTER — Encounter: Payer: Self-pay | Admitting: Surgery

## 2020-11-01 VITALS — BP 166/117 | HR 82 | Temp 98.5°F | Ht 71.0 in | Wt 240.2 lb

## 2020-11-01 DIAGNOSIS — Z9049 Acquired absence of other specified parts of digestive tract: Secondary | ICD-10-CM | POA: Insufficient documentation

## 2020-11-01 NOTE — Progress Notes (Signed)
[facility] POST[facility]OP OFFICE VISIT  11/01/2020  HPI: Ernest Boyd is a 50 y.o. male 15 days s/p robotic cholecystectomy.  He has seen his dentist regarding the abrasion/laceration of the medial aspect of his right posterior gumline near his molars.  He reportedly had a measurable defect at that time.  He attributes this to something that happened intraoperatively.  He reports having significant amount of pain with trying to eat food, which is gradually getting better.  Other than that he denies any abdominal pain, nausea, vomiting, fevers or chills.  He had been constipated.  He has been instructed to file a grievance with the hospital regarding the above.  And I am sure further evaluation will take place.  Vital signs: BP (!) 166/117 (BP Location: Right Arm)   Pulse 82   Temp 98.5 F (36.9 C) (Oral)   Ht 5' 11"  (1.803 m)   Wt 240 lb 3.2 oz (109 kg)   SpO2 97%   BMI 33.50 kg/m    Physical Exam: Constitutional: He appears well, no acute distress.  Looked at the area saw a small defect on his medial gums just below the gumline on the patient's right, and the posterior molars area.  Abdomen: Benign nontender. Skin: All incisions are clean, dry and intact.  Assessment/Plan: This is a 50 y.o. male 15 days s/p robotic cholecystectomy, most notable for some form of injury to his gums just inferior to the line of the medial aspect of the right molars.  Gradual progress.  Suspect possibly related to oral gastric tube placement, but that is purely speculative.  We will follow-up to see if there is anything else we can do to ensure this does not happen again.  Patient Active Problem List   Diagnosis Date Noted  . CCC (chronic calculous cholecystitis) 09/13/2020  . Calculus of gallbladder without cholecystitis without obstruction 01/19/2018    -We will be glad to see him back as needed.   [name] M.D., FACS 11/01/2020, 9:44 AM

## 2020-11-01 NOTE — H&P (Signed)
Chief Complaint: Right upper quadrant pain  History of Present Illness Ernest Boyd is a 50 y.o. male with a well-known history of cholelithiasis, diagnosed in 2019.  He was able to be essentially symptom-free for about a year and a half being extremely careful with his diet.  Unfortunately due to travel, he was eating out a lot and had a severe flareup in flight to [place].  He has had associated abdominal bloating, radiation to the back.  He denies nausea, vomiting, fevers or chills currently.  Reports regular bowel movements and denies acholic movements.  He denies any history of jaundice.  His last flareup was approximately 2 weeks ago that lasted 4 to 5 hours.  He waited in the ER and when the pain resolved he did not wait any longer.  Past Medical History     Past Medical History:  Diagnosis Date  . 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[place])   . Hyperlipidemia   . Hypertension   . NASH (nonalcoholic steatohepatitis)            Past Surgical History:  Procedure Laterality Date  . TONSILLECTOMY          Allergies  Allergen Reactions  . Dilantin [Phenytoin] Rash          Current Outpatient Medications  Medication Sig Dispense Refill  . lisinopril (PRINIVIL,ZESTRIL) 10 MG tablet     . meloxicam (MOBIC) 15 MG tablet Take 1 tablet (15 mg total) by mouth daily. 30 tablet 1  . simvastatin (ZOCOR) 40 MG tablet      No current facility-administered medications for this visit.    Family History      Family History  Problem Relation Age of Onset  . Hypertension Father       Social History Social History       Tobacco Use  . Smoking status: Never Smoker  . Smokeless tobacco: Never Used  Vaping Use  . Vaping Use: Never used  Substance Use Topics  . Alcohol use: No  . Drug use: Not Currently    Review of Systems  Constitutional: Negative.   HENT: Negative.   Eyes: Negative.   Respiratory: Negative.   Cardiovascular: Negative.   Gastrointestinal: Positive  for abdominal pain. Negative for constipation, heartburn, nausea and vomiting.  Genitourinary: Negative.   Skin: Negative.   Neurological: Negative.   Psychiatric/Behavioral: Negative.    Physical Exam Blood pressure (!) 179/120, pulse 67, temperature 98.5 F (36.9 C), temperature source Oral, height 5' 11"  (1.803 m), weight 246 lb 9.6 oz (111.9 kg), SpO2 97 %.   CONSTITUTIONAL: Well developed, and nourished, appropriately responsive and aware without distress.   EYES: Sclera non-icteric.   EARS, NOSE, MOUTH AND THROAT: Mask worn.  Hearing is intact to voice.  NECK: Trachea is midline, and there is no jugular venous distension.  LYMPH NODES:  Lymph nodes in the neck are not enlarged. RESPIRATORY:  Lungs are clear, and breath sounds are equal bilaterally. Normal respiratory effort without pathologic use of accessory muscles. CARDIOVASCULAR: Heart is regular in rate and rhythm. GI: The abdomen is soft, nontender, and nondistended.  He has a mild rectus diastases limited to the mid epigastrium.  There were no palpable masses. I did not appreciate hepatosplenomegaly. There were normal bowel sounds. MUSCULOSKELETAL:  Symmetrical muscle tone appreciated in all four extremities.    SKIN: Skin turgor is normal. No pathologic skin lesions appreciated.  NEUROLOGIC:  Motor and sensation appear grossly normal.  Cranial nerves are grossly without defect. PSYCH:  Alert and oriented to person, place and time. Affect is appropriate for situation.  Data Reviewed I have personally reviewed what is currently available of the patient's imaging, recent labs and medical records.    Imaging: Radiology review: Last ultrasound showed uncomplicated cholelithiasis, this was in 2019.  He had a normal common bile duct diameter at that time. Within last 24 hrs: No results found.  Assessment    Chronic calculus cholecystitis.     Patient Active Problem List   Diagnosis Date Noted  . Calculus of  gallbladder without cholecystitis without obstruction 01/19/2018    Plan    Robotic cholecystectomy. We will repeat right upper quadrant ultrasound along with LFTs and CBC preop. Optimal plan is for robotic cholecystectomy.  Risks and benefits have been discussed with the patient which include but are not limited to anesthesia, bleeding, infection, biliary ductal injury or stenosis, other associated unanticipated injuries affiliated with laparoscopic surgery.  I believe there is the desire to proceed, interpreter utilized as needed.  Questions elicited and answered to satisfaction.  No guarantees ever expressed or implied.

## 2020-11-01 NOTE — Patient Instructions (Signed)
If you have any concerns or questions, please feel free to call our office.     Gallbladder Eating Plan If you have a gallbladder condition, you may have trouble digesting fats. Eating a low-fat diet can help reduce your symptoms, and may be helpful before and after having surgery to remove your gallbladder (cholecystectomy). Your health care provider may recommend that you work with a diet and nutrition specialist (dietitian) to help you reduce the amount of fat in your diet. What are tips for following this plan? General guidelines  Limit your fat intake to less than 30% of your total daily calories. If you eat around 1,800 calories each day, this is less than 60 grams (g) of fat per day.  Fat is an important part of a healthy diet. Eating a low-fat diet can make it hard to maintain a healthy body weight. Ask your dietitian how much fat, calories, and other nutrients you need each day.  Eat small, frequent meals throughout the day instead of three large meals.  Drink at least 8-10 cups of fluid a day. Drink enough fluid to keep your urine clear or pale yellow.  Limit alcohol intake to no more than 1 drink a day for nonpregnant women and 2 drinks a day for men. One drink equals 12 oz of beer, 5 oz of wine, or 1 oz of hard liquor. Reading food labels  Check Nutrition Facts on food labels for the amount of fat per serving. Choose foods with less than 3 grams of fat per serving.   Shopping  Choose nonfat and low-fat healthy foods. Look for the words "nonfat," "low fat," or "fat free."  Avoid buying processed or prepackaged foods. Cooking  Cook using low-fat methods, such as baking, broiling, grilling, or boiling.  Cook with small amounts of healthy fats, such as olive oil, grapeseed oil, canola oil, or sunflower oil. What foods are recommended?  All fresh, frozen, or canned fruits and vegetables.  Whole grains.  Low-fat or non-fat (skim) milk and yogurt.  Lean meat, skinless  poultry, fish, eggs, and beans.  Low-fat protein supplement powders or drinks.  Spices and herbs. What foods are not recommended?  High-fat foods. These include baked goods, fast food, fatty cuts of meat, ice cream, french toast, sweet rolls, pizza, cheese bread, foods covered with butter, creamy sauces, or cheese.  Fried foods. These include french fries, tempura, battered fish, breaded chicken, fried breads, and sweets.  Foods with strong odors.  Foods that cause bloating and gas. Summary  A low-fat diet can be helpful if you have a gallbladder condition, or before and after gallbladder surgery.  Limit your fat intake to less than 30% of your total daily calories. This is about 60 g of fat if you eat 1,800 calories each day.  Eat small, frequent meals throughout the day instead of three large meals. This information is not intended to replace advice given to you by your health care provider. Make sure you discuss any questions you have with your health care provider. Document Revised: 02/16/2020 Document Reviewed: 02/16/2020 Elsevier Patient Education  2021 [place] POST-OPERATIVE PATIENT INSTRUCTIONS   WOUND CARE INSTRUCTIONS:  Keep a dry clean dressing on the wound if there is drainage. The initial bandage may be removed after 24 hours.  Once the wound has quit draining you may leave it open to air.  If clothing rubs against the wound or causes irritation and the wound is not draining  you may cover it with a dry dressing during the daytime.  Try to keep the wound dry and avoid ointments on the wound unless directed to do so.  If the wound becomes bright red and painful or starts to drain infected material that is not clear, please contact your physician immediately.  If the wound is mildly pink and has a thick firm ridge underneath it, this is normal, and is referred to as a healing ridge.  This will resolve over the next 4-6 weeks.  BATHING: You may shower  if you have been informed of this by your surgeon. However, Please do not submerge in a tub, hot tub, or pool until incisions are completely sealed or have been told by your surgeon that you may do so.  DIET:  You may eat any foods that you can tolerate.  It is a good idea to eat a high fiber diet and take in plenty of fluids to prevent constipation.  If you do become constipated you may want to take a mild laxative or take ducolax tablets on a daily basis until your bowel habits are regular.  Constipation can be very uncomfortable, along with straining, after recent surgery.  ACTIVITY:  You are encouraged to cough and deep breath or use your incentive spirometer if you were given one, every 15-30 minutes when awake.  This will help prevent respiratory complications and low grade fevers post-operatively if you had a general anesthetic.  You may want to hug a pillow when coughing and sneezing to add additional support to the surgical area, if you had abdominal or chest surgery, which will decrease pain during these times.  You are encouraged to walk and engage in light activity for the next two weeks.  You should not lift more than 10-20 pounds, 11/14/2020 until  as it could put you at increased risk for complications.  Twenty pounds is roughly equivalent to a plastic bag of groceries. At that time- Listen to your body when lifting, if you have pain when lifting, stop and then try again in a few days. Soreness after doing exercises or activities of daily living is normal as you get back in to your normal routine.  MEDICATIONS:  Try to take narcotic medications and anti-inflammatory medications, such as tylenol, ibuprofen, naprosyn, etc., with food.  This will minimize stomach upset from the medication.  Should you develop nausea and vomiting from the pain medication, or develop a rash, please discontinue the medication and contact your physician.  You should not drive, make important decisions, or operate  machinery when taking narcotic pain medication.  SUNBLOCK Use sun block to incision area over the next year if this area will be exposed to sun. This helps decrease scarring and will allow you avoid a permanent darkened area over your incision.

## 2020-11-02 ENCOUNTER — Other Ambulatory Visit: Payer: Self-pay | Admitting: Surgery

## 2021-02-25 ENCOUNTER — Ambulatory Visit: Payer: Self-pay

## 2021-08-28 ENCOUNTER — Other Ambulatory Visit: Payer: Self-pay

## 2021-08-28 ENCOUNTER — Ambulatory Visit
Admission: RE | Admit: 2021-08-28 | Discharge: 2021-08-28 | Disposition: A | Discharge status: Home / Self Care | Payer: 59 | Source: Ambulatory Visit

## 2021-08-28 VITALS — BP 170/111 | HR 79 | Temp 98.1°F | Resp 18

## 2021-08-28 DIAGNOSIS — Z760 Encounter for issue of repeat prescription: Secondary | ICD-10-CM

## 2021-08-28 DIAGNOSIS — M5441 Lumbago with sciatica, right side: Secondary | ICD-10-CM

## 2021-08-28 DIAGNOSIS — I10 Essential (primary) hypertension: Secondary | ICD-10-CM

## 2021-08-28 MED ORDER — LISINOPRIL 10 MG PO TABS: 10.0000 mg | ORAL_TABLET | ORAL | 0 refills | Status: DC

## 2021-08-28 MED ORDER — MELOXICAM 7.5 MG PO TABS: 7.5000 mg | ORAL_TABLET | Freq: Every day | ORAL | 0 refills | Status: DC

## 2021-08-28 MED ORDER — METHOCARBAMOL 500 MG PO TABS: 500.0000 mg | ORAL_TABLET | Freq: Two times a day (BID) | ORAL | 0 refills | Status: DC | PRN

## 2021-08-28 NOTE — Discharge Instructions (Addendum)
Take the meloxicam and muscle relaxer as directed.  When taking the muscle relaxer, do not drive, operate machinery, or drink alcohol with this medication as it can cause drowsiness. Follow up with your primary care provider or an orthopedist if your symptoms are not improving.    Your blood pressure is elevated today at 170/111.  Please have this rechecked by your primary care provider in 1-2 weeks.

## 2021-08-28 NOTE — ED Triage Notes (Signed)
Pt c/o mid lower back pain that started yesterday. It hurts worse when he sits.
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
## 2022-09-18 ENCOUNTER — Ambulatory Visit
Admission: RE | Admit: 2022-09-18 | Discharge: 2022-09-18 | Disposition: A | Discharge status: Home / Self Care | Payer: 59 | Source: Ambulatory Visit | Attending: Urgent Care | Admitting: Urgent Care

## 2022-09-18 VITALS — BP 168/111 | HR 90 | Temp 98.0°F | Resp 16

## 2022-09-18 DIAGNOSIS — J069 Acute upper respiratory infection, unspecified: Secondary | ICD-10-CM

## 2022-09-18 MED ORDER — BENZONATATE 100 MG PO CAPS: ORAL_CAPSULE | ORAL | 0 refills | Status: DC

## 2022-09-18 NOTE — Discharge Instructions (Addendum)
Use cold/cough medication for "high blood pressure".  Follow up here or with your primary care provider if your symptoms are worsening or not improving.
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
## 2022-09-18 NOTE — ED Triage Notes (Signed)
Patient presents to UC for cough and nasal congestion since Monday. Treating symptoms with robitussin. States it helps for a few hours but he has a hx of HTN so only took it twice.   Denies fever.

## 2022-09-19 ENCOUNTER — Encounter: Payer: Self-pay | Admitting: Family

## 2022-09-19 ENCOUNTER — Ambulatory Visit: Payer: 59 | Admitting: Family

## 2022-09-19 VITALS — BP 142/110 | HR 98 | Temp 98.6°F | Ht 71.0 in | Wt 242.2 lb

## 2022-09-19 DIAGNOSIS — E669 Obesity, unspecified: Secondary | ICD-10-CM | POA: Diagnosis not present

## 2022-09-19 DIAGNOSIS — M2550 Pain in unspecified joint: Secondary | ICD-10-CM | POA: Diagnosis not present

## 2022-09-19 DIAGNOSIS — I10 Essential (primary) hypertension: Secondary | ICD-10-CM

## 2022-09-19 DIAGNOSIS — H6693 Otitis media, unspecified, bilateral: Secondary | ICD-10-CM

## 2022-09-19 DIAGNOSIS — Z131 Encounter for screening for diabetes mellitus: Secondary | ICD-10-CM

## 2022-09-19 DIAGNOSIS — E782 Mixed hyperlipidemia: Secondary | ICD-10-CM

## 2022-09-19 DIAGNOSIS — R051 Acute cough: Secondary | ICD-10-CM

## 2022-09-19 LAB — BASIC METABOLIC PANEL
BUN: 13 mg/dL (ref 6–23)
CO2: 26 mEq/L (ref 19–32)
Calcium: 10.2 mg/dL (ref 8.4–10.5)
Chloride: 101 mEq/L (ref 96–112)
Creatinine, Ser: 1.01 mg/dL (ref 0.40–1.50)
GFR: 85.62 mL/min (ref >60.00)
Glucose, Bld: 116 mg/dL — ABNORMAL HIGH (ref 70–99)
Potassium: 4 mEq/L (ref 3.5–5.1)
Sodium: 136 mEq/L (ref 135–145)

## 2022-09-19 LAB — MICROALBUMIN / CREATININE URINE RATIO
Creatinine,U: 380.1 mg/dL
Microalb Creat Ratio: 1 mg/g (ref 0.0–30.0)
Microalb, Ur: 3.8 mg/dL — ABNORMAL HIGH (ref 0.0–1.9)

## 2022-09-19 LAB — LIPID PANEL
Cholesterol: 209 mg/dL — ABNORMAL HIGH (ref 0–200)
HDL: 44.3 mg/dL (ref >39.00)
LDL Cholesterol: 137 mg/dL — ABNORMAL HIGH (ref 0–99)
NonHDL: 164.2
Total CHOL/HDL Ratio: 5
Triglycerides: 134 mg/dL (ref 0.0–149.0)
VLDL: 26.8 mg/dL (ref 0.0–40.0)

## 2022-09-19 LAB — SEDIMENTATION RATE: Sed Rate: 21 mm/hr — ABNORMAL HIGH (ref 0–20)

## 2022-09-19 LAB — CBC
HCT: 48.9 % (ref 39.0–52.0)
Hemoglobin: 17 g/dL (ref 13.0–17.0)
MCHC: 34.7 g/dL (ref 30.0–36.0)
MCV: 83.8 fl (ref 78.0–100.0)
Platelets: 251 10*3/uL (ref 150.0–400.0)
RBC: 5.84 Mil/uL — ABNORMAL HIGH (ref 4.22–5.81)
RDW: 13.5 % (ref 11.5–15.5)
WBC: 7 10*3/uL (ref 4.0–10.5)

## 2022-09-19 LAB — HEMOGLOBIN A1C: Hgb A1c MFr Bld: 5.8 % (ref 4.6–6.5)

## 2022-09-19 MED ORDER — AMOXICILLIN-POT CLAVULANATE 875-125 MG PO TABS: 1.0000 | ORAL_TABLET | Freq: Two times a day (BID) | ORAL | 0 refills | Status: DC

## 2022-09-19 MED ORDER — AMLODIPINE BESYLATE 5 MG PO TABS: 5.0000 mg | ORAL_TABLET | Freq: Every day | ORAL | 3 refills | Status: DC

## 2022-09-19 MED ORDER — MELOXICAM 7.5 MG PO TABS: 7.5000 mg | ORAL_TABLET | Freq: Every day | ORAL | 2 refills | Status: DC

## 2022-09-19 MED ORDER — METHYLPREDNISOLONE 4 MG PO TBPK: ORAL_TABLET | ORAL | 0 refills | Status: DC
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
## 2022-09-19 NOTE — Assessment & Plan Note (Signed)
Take antibiotic as prescribed. Increase oral fluids. Pt to f/u if sx worsen and or fail to improve in 2-3 days. rx augmentin 875/125 mg po bid x 10 days Rx medrol dose pack 40 mg for breathing difficulties and wheezing Albuterol HFA as needed

## 2022-09-19 NOTE — Assessment & Plan Note (Signed)
Uncontrolled.  Pt advised of the following:  Continue medication as prescribed. Monitor blood pressure periodically and/or when you feel symptomatic. Goal is <130/90 on average. Ensure that you have rested for 30 minutes prior to checking your blood pressure. Record your readings and bring them to your next visit if necessary.work on a low sodium diet. Continue lisinopril 10 mg once daily  Start amlodipine 5 mg once daily

## 2022-09-19 NOTE — Patient Instructions (Signed)
Start daily amlodipine 5 mg for your blood pressure Continue lisinopril 10 mg once daily.    ------------------------------------  Start monitoring your blood pressure daily, around the same time of day, for the next 2-3 weeks.  Ensure that you have rested for 30 minutes prior to checking your blood pressure. Record your readings and bring them to your next visit.  ------------------------------------   Also going to send you in refill of meloxicam 7.5 mg once daily.    ------------------------------------ Welcome to our clinic, I am happy to have you as my new patient. I am excited to continue on this healthcare journey with you.  ------------------------------------  Stop by the lab prior to leaving today. I will notify you of your results once received.   Please keep in mind Any my chart messages you send have up to a three business day turnaround for a response.  Phone calls may take up to a one full business day turnaround for a  response.   If you need a medication refill I recommend you request it through the pharmacy as this is easiest for [place] rather than sending a message and or phone call.   Due to recent changes in healthcare laws, you may see results of your imaging and/or laboratory studies on MyChart before I have had a chance to review them.  I understand that in some cases there may be results that are confusing or concerning to you. Please understand that not all results are received at the same time and often I may need to interpret multiple results in order to provide you with the best plan of care or course of treatment. Therefore, I ask that you please give me 2 business days to thoroughly review all your results before contacting my office for clarification. Should we see a critical lab result, you will be contacted sooner.   It was a pleasure seeing you today! Please do not hesitate to reach out with any questions and or concerns.  Regards,   Eugenia Pancoast FNP-C

## 2022-09-19 NOTE — Progress Notes (Signed)
Pending additional labs for full clinical picture

## 2022-09-19 NOTE — Assessment & Plan Note (Signed)
Pt advised to work on diet and exercise as tolerated  

## 2022-09-19 NOTE — Assessment & Plan Note (Signed)
Ordered lipid panel, pending results. Work on low cholesterol diet and exercise as tolerated Continue zetia  May restart vascepa pending results

## 2022-09-22 ENCOUNTER — Other Ambulatory Visit: Payer: Self-pay | Admitting: Family

## 2022-09-22 DIAGNOSIS — R768 Other specified abnormal immunological findings in serum: Secondary | ICD-10-CM | POA: Insufficient documentation

## 2022-09-22 DIAGNOSIS — M2550 Pain in unspecified joint: Secondary | ICD-10-CM

## 2022-09-22 DIAGNOSIS — R7689 Other specified abnormal immunological findings in serum: Secondary | ICD-10-CM

## 2022-09-22 NOTE — Progress Notes (Signed)
With joint pains, and stiffness in the am, I ordered a test for autoimmune disease which did come back positive with an elevated sed rate which suggests inflammation.   I will refer you to a rheumatologist to evaluate further if you have some short of arthritic autoimmune disease and or mixed connective tissue disease.

## 2022-09-23 ENCOUNTER — Encounter: Payer: Self-pay | Admitting: Family

## 2022-09-24 MED ORDER — LEVOFLOXACIN 750 MG PO TABS: 750.0000 mg | ORAL_TABLET | Freq: Every day | ORAL | 0 refills | Status: DC

## 2022-09-24 NOTE — Addendum Note (Signed)
Addended by: Eugenia Pancoast on: 09/24/2022 08:00 AM   Modules accepted: Orders

## 2022-09-24 NOTE — Progress Notes (Signed)
Responded by mychart

## 2022-09-26 LAB — TIER 3
Centromere Ab Screen: <1 AI
Ribosomal P Protein Ab: <1 AI

## 2022-09-26 LAB — TIER 2
Jo-1 Autoabs: <1 AI
SSA (Ro) (ENA) Antibody, IgG: <1 AI
SSB (La) (ENA) Antibody, IgG: <1 AI
Scleroderma (Scl-70) (ENA) Antibody, IgG: <1 AI

## 2022-09-26 LAB — TIER 1
Chromatin (Nucleosomal) Antibody: <1 AI
ENA SM Ab Ser-aCnc: <1 AI
Ribonucleic Protein(ENA) Antibody, IgG: <1 AI
SM/RNP: <1 AI
ds DNA Ab: 1 IU/mL

## 2022-09-26 LAB — ANA SCREEN,IFA,REFLEX TITER/PATTERN,REFLEX MPLX 11 AB CASCADE
Anti Nuclear Antibody (ANA): POSITIVE — AB
Cyclic Citrullin Peptide Ab: <16 UNITS
MUTATED CITRULLINATED VIMENTIN (MCV) AB: <20 U/mL (ref <20)
Rheumatoid fact SerPl-aCnc: <14 IU/mL (ref <14)

## 2022-09-26 LAB — INTERPRETATION

## 2022-09-26 LAB — ANTI-NUCLEAR AB-TITER (ANA TITER): ANA Titer 1: 1:80 {titer} — ABNORMAL HIGH

## 2022-09-29 ENCOUNTER — Ambulatory Visit: Payer: 59 | Admitting: Family

## 2022-09-29 ENCOUNTER — Ambulatory Visit (INDEPENDENT_AMBULATORY_CARE_PROVIDER_SITE_OTHER)
Admission: RE | Admit: 2022-09-29 | Discharge: 2022-09-29 | Disposition: A | Discharge status: Home / Self Care | Payer: 59 | Source: Ambulatory Visit | Attending: Family | Admitting: Family

## 2022-09-29 ENCOUNTER — Encounter: Payer: Self-pay | Admitting: Family

## 2022-09-29 VITALS — BP 126/84 | HR 72 | Temp 97.6°F | Ht 71.0 in | Wt 252.8 lb

## 2022-09-29 DIAGNOSIS — R0683 Snoring: Secondary | ICD-10-CM

## 2022-09-29 DIAGNOSIS — R0602 Shortness of breath: Secondary | ICD-10-CM

## 2022-09-29 DIAGNOSIS — H9201 Otalgia, right ear: Secondary | ICD-10-CM

## 2022-09-29 MED ORDER — PREDNISONE 20 MG PO TABS: ORAL_TABLET | ORAL | 0 refills | Status: DC

## 2022-09-29 MED ORDER — FLUTICASONE-SALMETEROL 250-50 MCG/ACT IN AEPB: 1.0000 | INHALATION_SPRAY | Freq: Two times a day (BID) | RESPIRATORY_TRACT | 0 refills | Status: DC

## 2022-09-29 NOTE — Assessment & Plan Note (Signed)
Rx advair to start trial to see if calms down exacerbation.  Cxr today for 2-3 weeks ongoing cough.  Prednisone 40 mg once daily for five days  Albuterol prn Can consider Singulair and or spirometry

## 2022-09-29 NOTE — Assessment & Plan Note (Signed)
Referral placed for sleep study to r/o sleep apnea

## 2022-09-29 NOTE — Assessment & Plan Note (Signed)
Prednisone 20 mg twice daily for five days  Continue claritin, flonase.

## 2022-09-29 NOTE — Progress Notes (Signed)
Established Patient Office Visit  Subjective:   Patient ID: Ernest Boyd, male    DOB: 1970/08/25  Age: 52 y.o. MRN: [id number]  CC:  Chief Complaint  Patient presents with   Cough   Ear Pain    HPI: RENE LARDY is a 52 y.o. male presenting on 09/29/2022 for Cough and Ear Pain    Cough    Seen 3/8 dx with bil acute otitis media. Given augmentin x 10 days and medrol dose pack with only mild relief.  Albuterol inhaler as well was given.  Pt had reached out 3/13 stating that he wasn't feeling better. Was advised to stop augmentin and start levaquin. Was also suggested to start flonase and zyrtec otc. He state ears still feel like there is water in his eyes as if he just got done swimming. The ear pain is worse on the right side and radiates down the right side of the neck. He also felt a slight itchy sensation down in his ear. Slight sinus pressure right side.   He does also state that he has been snoring more since 2023, his girlfriend states he snores a lot.      ROS: Negative unless specifically indicated above in HPI.   Relevant past medical history reviewed and updated as indicated.   Allergies and medications reviewed and updated.   Current Outpatient Medications:    albuterol (VENTOLIN HFA) 108 (90 Base) MCG/ACT inhaler, SMARTSIG:1-2 Puff(s) By Mouth Every 4-6 Hours PRN, Disp: , Rfl:    amLODipine (NORVASC) 5 MG tablet, Take 1 tablet (5 mg total) by mouth daily., Disp: 90 tablet, Rfl: 3   calcium carbonate (TUMS - DOSED IN MG ELEMENTAL CALCIUM) 500 MG chewable tablet, Chew 1 tablet by mouth as needed for indigestion or heartburn., Disp: , Rfl:    ezetimibe (ZETIA) 10 MG tablet, Take 10 mg by mouth at bedtime., Disp: , Rfl:    fluticasone-salmeterol (ADVAIR) 250-50 MCG/ACT AEPB, Inhale 1 puff into the lungs in the morning and at bedtime., Disp: 1 each, Rfl: 0   lisinopril (ZESTRIL) 10 MG tablet, Take 1 tablet (10 mg total) by mouth every morning., Disp: 30 tablet, Rfl:  0   meloxicam (MOBIC) 7.5 MG tablet, Take 1 tablet (7.5 mg total) by mouth daily., Disp: 90 tablet, Rfl: 2   methocarbamol (ROBAXIN) 500 MG tablet, Take 1 tablet (500 mg total) by mouth 2 (two) times daily as needed for muscle spasms., Disp: 10 tablet, Rfl: 0   predniSONE (DELTASONE) 20 MG tablet, Take two tablets once daily for five days, Disp: 10 tablet, Rfl: 0  Allergies  Allergen Reactions   Dilantin [Phenytoin] Rash   Morphine And Related Nausea And Vomiting   Nexlizet [Bempedoic Acid-Ezetimibe] Other (See Comments)    myalgias   Vicodin Hp [Hydrocodone-Acetaminophen] Nausea And Vomiting    Objective:   BP 126/84   Pulse 72   Temp 97.6 F (36.4 C) (Temporal)   Ht 5\' 11"  (1.803 m)   Wt 252 lb 12.8 oz (114.7 kg)   SpO2 98%   BMI 35.26 kg/m    Physical Exam Vitals reviewed.  Constitutional:      General: He is not in acute distress.    Appearance: Normal appearance. He is obese. He is not ill-appearing, toxic-appearing or diaphoretic.  HENT:     Head: Normocephalic.     Right Ear: Tympanic membrane normal.     Left Ear: Tympanic membrane normal.     Nose: Nose normal.  Mouth/Throat:     Mouth: Mucous membranes are moist.  Eyes:     Pupils: Pupils are equal, round, and reactive to light.  Cardiovascular:     Rate and Rhythm: Normal rate and regular rhythm.  Pulmonary:     Effort: Pulmonary effort is normal.     Breath sounds: Normal breath sounds. Decreased air movement present. No wheezing.  Musculoskeletal:        General: Normal range of motion.     Cervical back: Normal range of motion.  Neurological:     General: No focal deficit present.     Mental Status: He is alert and oriented to person, place, and time. Mental status is at baseline.  Psychiatric:        Mood and Affect: Mood normal.        Behavior: Behavior normal.        Thought Content: Thought content normal.        Judgment: Judgment normal.     Assessment & Plan:  Snoring Assessment &  Plan: Referral placed for sleep study to r/o sleep apnea  Orders: -     Ambulatory referral to Pulmonology -     Fluticasone-Salmeterol; Inhale 1 puff into the lungs in the morning and at bedtime.  Dispense: 1 each; Refill: 0  Acute otalgia, right Assessment & Plan: Prednisone 20 mg twice daily for five days  Continue claritin, flonase.    Shortness of breath Assessment & Plan: Rx advair to start trial to see if calms down exacerbation.  Cxr today for 2-3 weeks ongoing cough.  Prednisone 40 mg once daily for five days  Albuterol prn Can consider Singulair and or spirometry   Orders: -     DG Chest 2 View; Future -     Fluticasone-Salmeterol; Inhale 1 puff into the lungs in the morning and at bedtime.  Dispense: 1 each; Refill: 0 -     predniSONE; Take two tablets once daily for five days  Dispense: 10 tablet; Refill: 0     Follow up plan: Return if symptoms worsen or fail to improve.  Eugenia Pancoast, FNP

## 2022-09-29 NOTE — Patient Instructions (Addendum)
A referral was placed today for sleep study. Go when you are feeling better.  Please let us know if you have not heard back within 2 weeks about the referral.  Recommend you start daily inhaler and take five more days of prednisone.    Regards,   Eugenia Pancoast FNP-C

## 2022-10-10 ENCOUNTER — Telehealth: Payer: Self-pay | Admitting: Pharmacy Technician

## 2022-10-10 DIAGNOSIS — I10 Essential (primary) hypertension: Secondary | ICD-10-CM

## 2022-10-10 NOTE — Telephone Encounter (Signed)
The patient's drug benefit plan provides coverage for other drugs which may be considered for treating your patient. Can your patient be treated with a formulary drug? Available Formulary Alternatives: fluticasone-salmeterol [id number], [id number]), Wixela Inhub, BREO ELLIPTA (except certain NDCs) [NOTE: If yes, provide your patient with a new prescription for the formulary product.]

## 2022-10-10 NOTE — Telephone Encounter (Signed)
Patient Advocate Encounter   Received notification that prior authorization for Fluticasone-Salmeterol 250-50MCG/ACT aerosol powder is required.   PA submitted on 10/10/2022 [id number] Electronic PA Form Status is pending

## 2022-10-13 ENCOUNTER — Other Ambulatory Visit: Payer: Self-pay | Admitting: Nurse Practitioner

## 2022-10-13 DIAGNOSIS — R0602 Shortness of breath: Secondary | ICD-10-CM

## 2022-10-13 MED ORDER — FLUTICASONE-SALMETEROL 250-50 MCG/ACT IN AEPB: 1.0000 | INHALATION_SPRAY | Freq: Two times a day (BID) | RESPIRATORY_TRACT | 0 refills | Status: DC

## 2022-10-13 NOTE — Telephone Encounter (Signed)
Advair was not covered. I changed the script to [facility]. Can we call the pharmacy and cancel the Advair script and let the patient know of the change please

## 2022-10-14 NOTE — Telephone Encounter (Signed)
Reached out to the pharmacy to cancel Advair, and was advised that their pharmacy does not carry Wixela. Patient will need to provide another pharmacy to send this medication to. Sent a message to patient via [place]. Waiting for response.

## 2022-10-17 ENCOUNTER — Other Ambulatory Visit: Payer: Self-pay | Admitting: Family

## 2022-10-17 NOTE — Telephone Encounter (Signed)
lisinopril (ZESTRIL) 10 MG tablet  Did not see this has been previously sent by [place]. NV- 10/24/22 LV- 09/29/22

## 2022-10-17 NOTE — Telephone Encounter (Signed)
Prescription Request  10/17/2022  LOV: 09/29/2022  What is the name of the medication or equipment?  lisinopril (ZESTRIL) 10 MG tablet  Have you contacted your pharmacy to request a refill? Yes   Which pharmacy would you like this sent to?   [id number] [place], [place] - [address] [place] [address] Phone: [phone] Fax: [phone]    Patient notified that their request is being sent to the clinical staff for review and that they should receive a response within 2 business days.   Please advise at Mobile [phone] (mobile)

## 2022-10-20 ENCOUNTER — Other Ambulatory Visit: Payer: Self-pay | Admitting: Family

## 2022-10-20 DIAGNOSIS — R0602 Shortness of breath: Secondary | ICD-10-CM

## 2022-10-20 MED ORDER — LISINOPRIL 10 MG PO TABS: 10.0000 mg | ORAL_TABLET | ORAL | 3 refills | Status: DC

## 2022-10-20 MED ORDER — FLUTICASONE-SALMETEROL 250-50 MCG/ACT IN AEPB: 1.0000 | INHALATION_SPRAY | Freq: Two times a day (BID) | RESPIRATORY_TRACT | 0 refills | Status: DC

## 2022-10-20 NOTE — Addendum Note (Signed)
Addended by: Mort Sawyers on: 10/20/2022 07:32 AM   Modules accepted: Orders

## 2022-10-24 ENCOUNTER — Telehealth: Payer: Self-pay

## 2022-10-24 ENCOUNTER — Ambulatory Visit: Payer: 59 | Admitting: Family

## 2022-10-24 ENCOUNTER — Encounter: Payer: Self-pay | Admitting: Family

## 2022-10-24 VITALS — BP 128/84 | HR 68 | Temp 98.1°F | Ht 71.0 in | Wt 249.4 lb

## 2022-10-24 DIAGNOSIS — R0683 Snoring: Secondary | ICD-10-CM

## 2022-10-24 DIAGNOSIS — M2550 Pain in unspecified joint: Secondary | ICD-10-CM | POA: Diagnosis not present

## 2022-10-24 DIAGNOSIS — I10 Essential (primary) hypertension: Secondary | ICD-10-CM

## 2022-10-24 DIAGNOSIS — Z789 Other specified health status: Secondary | ICD-10-CM

## 2022-10-24 DIAGNOSIS — R5383 Other fatigue: Secondary | ICD-10-CM

## 2022-10-24 DIAGNOSIS — R0609 Other forms of dyspnea: Secondary | ICD-10-CM

## 2022-10-24 DIAGNOSIS — R6882 Decreased libido: Secondary | ICD-10-CM | POA: Diagnosis not present

## 2022-10-24 DIAGNOSIS — G720 Drug-induced myopathy: Secondary | ICD-10-CM

## 2022-10-24 DIAGNOSIS — R768 Other specified abnormal immunological findings in serum: Secondary | ICD-10-CM

## 2022-10-24 DIAGNOSIS — T466X5A Adverse effect of antihyperlipidemic and antiarteriosclerotic drugs, initial encounter: Secondary | ICD-10-CM

## 2022-10-24 DIAGNOSIS — R0602 Shortness of breath: Secondary | ICD-10-CM | POA: Insufficient documentation

## 2022-10-24 DIAGNOSIS — E782 Mixed hyperlipidemia: Secondary | ICD-10-CM

## 2022-10-24 MED ORDER — REPATHA 140 MG/ML ~~LOC~~ SOSY: PREFILLED_SYRINGE | SUBCUTANEOUS | 5 refills | Status: DC

## 2022-10-24 MED ORDER — ALBUTEROL SULFATE HFA 108 (90 BASE) MCG/ACT IN AERS: INHALATION_SPRAY | RESPIRATORY_TRACT | 1 refills | Status: DC

## 2022-10-24 NOTE — Telephone Encounter (Signed)
-----   Message from Tabitha Dugal, FNP sent at 10/24/2022 10:34 AM EDT ----- Regarding: pa Can we please get PA for repatha? RX placed  

## 2022-10-24 NOTE — Assessment & Plan Note (Addendum)
Work on low cholesterol diet and exercise as tolerated Statin intolerance severe myalgia with use of simvastatin  Will attempt to get repatha approved

## 2022-10-24 NOTE — Progress Notes (Signed)
Established Patient Office Visit  Subjective:      CC:  Chief Complaint  Patient presents with   Medical Management of Chronic Issues    HPI: Ernest Boyd is a 52 y.o. male presenting on 10/24/2022 for Medical Management of Chronic Issues . Sob, ongoing.  Trial start advair, CXR was within office 3/18 no acute findings  Was sent home with prednisone 40 mg once daily for five days and use of albuterol prn was recommended. He states advair was not approved, but wixela was approved has to pick up wixela today as he has been out of town. Feeling much better since resolution of his upper respiratory infection. He states only feels sob with doe, jogging and or going up stairs. He feels RAD more exercise induced.   Acute otalgia, previously treated with Levaquin and ongoing arthralgia 3/18 was given prednisone. Advised to continue with claritin and flonase.   Ana positive 3/8 , referred to rheumatology.   Positive urine m/a: on lisinopril 10 mg once daily for management   Snoring, r/o sleep apnea, pt has appt coming up 5/3 for eval   Arthralgias, now on melodic 7.5 mg once daily and he states this has given him significant improvement   HTN: on amlodipine 5 mg once daily. Feeing well no cp palp and or sob.  Doesn't check at home but today with blood pressure 128/78  Wt Readings from Last 3 Encounters:  10/24/22 249 lb 6.4 oz (113.1 kg)  09/29/22 252 lb 12.8 oz (114.7 kg)  09/19/22 242 lb 3.2 oz (109.9 kg)   Temp Readings from Last 3 Encounters:  10/24/22 98.1 F (36.7 C) (Temporal)  09/29/22 97.6 F (36.4 C) (Temporal)  09/19/22 98.6 F (37 C) (Temporal)   BP Readings from Last 3 Encounters:  10/24/22 128/84  09/29/22 126/84  09/19/22 (!) 142/110   Pulse Readings from Last 3 Encounters:  10/24/22 68  09/29/22 72  09/19/22 98   Pt concerned with fatigue, is pending for sleep apnea, he is concerned about his level of testosterone. Does not have issues with ED and or  with achieving orgasms but does have low libido.        Social history:  Relevant past medical, surgical, family and social history reviewed and updated as indicated. Interim medical history since our last visit reviewed.  Allergies and medications reviewed and updated.  DATA REVIEWED: CHART IN EPIC     ROS: Negative unless specifically indicated above in HPI.    Current Outpatient Medications:    amLODipine (NORVASC) 5 MG tablet, Take 1 tablet (5 mg total) by mouth daily., Disp: 90 tablet, Rfl: 3   calcium carbonate (TUMS - DOSED IN MG ELEMENTAL CALCIUM) 500 MG chewable tablet, Chew 1 tablet by mouth as needed for indigestion or heartburn., Disp: , Rfl:    Evolocumab (REPATHA) 140 MG/ML SOSY, Inject one injection sq every two weeks, Disp: 2 mL, Rfl: 5   ezetimibe (ZETIA) 10 MG tablet, Take 10 mg by mouth at bedtime., Disp: , Rfl:    lisinopril (ZESTRIL) 10 MG tablet, Take 1 tablet (10 mg total) by mouth every morning., Disp: 90 tablet, Rfl: 3   meloxicam (MOBIC) 7.5 MG tablet, Take 1 tablet (7.5 mg total) by mouth daily., Disp: 90 tablet, Rfl: 2   methocarbamol (ROBAXIN) 500 MG tablet, Take 1 tablet (500 mg total) by mouth 2 (two) times daily as needed for muscle spasms., Disp: 10 tablet, Rfl: 0   albuterol (VENTOLIN HFA) 108 (  90 Base) MCG/ACT inhaler, SMARTSIG:1-2 Puff(s) By Mouth Every 4-6 Hours PRN, Disp: 1 each, Rfl: 1      Objective:    BP 128/84   Pulse 68   Temp 98.1 F (36.7 C) (Temporal)   Ht 5\' 11"  (1.803 m)   Wt 249 lb 6.4 oz (113.1 kg)   SpO2 99%   BMI 34.78 kg/m   Wt Readings from Last 3 Encounters:  10/24/22 249 lb 6.4 oz (113.1 kg)  09/29/22 252 lb 12.8 oz (114.7 kg)  09/19/22 242 lb 3.2 oz (109.9 kg)    Physical Exam Vitals reviewed.  Constitutional:      General: He is not in acute distress.    Appearance: Normal appearance. He is obese. He is not ill-appearing, toxic-appearing or diaphoretic.  HENT:     Head: Normocephalic.     Right Ear:  Tympanic membrane normal.     Left Ear: Tympanic membrane normal.     Nose: Nose normal.     Mouth/Throat:     Mouth: Mucous membranes are moist.  Eyes:     Pupils: Pupils are equal, round, and reactive to light.  Cardiovascular:     Rate and Rhythm: Normal rate and regular rhythm.  Pulmonary:     Effort: Pulmonary effort is normal.     Breath sounds: Normal breath sounds. No wheezing.  Musculoskeletal:        General: Normal range of motion.     Cervical back: Normal range of motion.  Neurological:     General: No focal deficit present.     Mental Status: He is alert and oriented to person, place, and time. Mental status is at baseline.  Psychiatric:        Mood and Affect: Mood normal.        Behavior: Behavior normal.        Thought Content: Thought content normal.        Judgment: Judgment normal.            Assessment & Plan:  Low libido Assessment & Plan: Will assess testosterone bio pending results  Orders: -     Testosterone Total,Free,Bio, Males  Other fatigue -     Testosterone Total,Free,Bio, Males  DOE (dyspnea on exertion) -     Albuterol Sulfate HFA; SMARTSIG:1-2 Puff(s) By Mouth Every 4-6 Hours PRN  Dispense: 1 each; Refill: 1  Polyarthralgia Assessment & Plan: Improving  Continue meloxicam   Positive ANA (antinuclear antibody) Assessment & Plan: Did encourage pt to f/u consult with rhuematology    Mixed hyperlipidemia Assessment & Plan: Work on low cholesterol diet and exercise as tolerated Statin intolerance severe myalgia with use of simvastatin  Will attempt to get repatha approved   Orders: -     Repatha; Inject one injection sq every two weeks  Dispense: 2 mL; Refill: 5  Primary hypertension Assessment & Plan: Continue amlodipine and lisinopril  Improved    Snoring Assessment & Plan: Pending appt for eval for sleep apnea with pulmonary in may    Statin intolerance -     Repatha; Inject one injection sq every two weeks   Dispense: 2 mL; Refill: 5  Statin myopathy -     Repatha; Inject one injection sq every two weeks  Dispense: 2 mL; Refill: 5     Return in about 6 months (around 04/25/2023) for f/u blood pressure.  Mort Sawyers, MSN, APRN, FNP-C [facility]

## 2022-10-24 NOTE — Assessment & Plan Note (Signed)
Will assess testosterone bio pending results

## 2022-10-24 NOTE — Patient Instructions (Addendum)
  Do not recommend wixela use, will pend results of spirometry   Can use inhaler prn   Call back rheumatologist as well.   Use nightly claritin    Regards,   Miken Stecher FNP-C

## 2022-10-24 NOTE — Assessment & Plan Note (Signed)
Did encourage pt to f/u consult with rhuematology

## 2022-10-24 NOTE — Assessment & Plan Note (Signed)
Improving.  Continue meloxicam.

## 2022-10-24 NOTE — Telephone Encounter (Signed)
Pharmacy Patient Advocate Encounter  Prior Authorization for Repatha has been approved by [employer] (ins).    PA # [id number] Effective dates: 10/24/2022 through 10/24/2023   Patient Advocate Encounter   Received notification from [employer] that prior authorization for Repatha is required.   PA submitted on 04/12/204 [place] [id number] Status is pending

## 2022-10-24 NOTE — Telephone Encounter (Signed)
Hello,  A PA is needed for Evolocumab (REPATHA) 140 MG/ML SOSY.   Thanks,

## 2022-10-24 NOTE — Telephone Encounter (Signed)
-----   Message from Mort Sawyers, [place] sent at 10/24/2022 10:34 AM EDT ----- Regarding: pa Can we please get PA for repatha? RX placed

## 2022-10-24 NOTE — Assessment & Plan Note (Signed)
Pending appt for eval for sleep apnea with pulmonary in may

## 2022-10-24 NOTE — Assessment & Plan Note (Signed)
Continue amlodipine and lisinopril  Improved

## 2022-10-25 LAB — TESTOSTERONE TOTAL,FREE,BIO, MALES
Albumin: 4.1 g/dL (ref 3.6–5.1)
Sex Hormone Binding: 53 nmol/L — ABNORMAL HIGH (ref 10–50)
Testosterone, Bioavailable: 94.4 ng/dL — ABNORMAL LOW (ref 110.0–575.0)
Testosterone, Free: 50.1 pg/mL (ref 46.0–224.0)
Testosterone: 547 ng/dL (ref 250–827)

## 2022-10-29 ENCOUNTER — Other Ambulatory Visit: Payer: Self-pay | Admitting: Family

## 2022-10-29 DIAGNOSIS — R6882 Decreased libido: Secondary | ICD-10-CM

## 2022-10-29 DIAGNOSIS — R7989 Other specified abnormal findings of blood chemistry: Secondary | ICD-10-CM

## 2022-10-30 ENCOUNTER — Other Ambulatory Visit (HOSPITAL_COMMUNITY): Payer: Self-pay

## 2022-10-30 NOTE — Telephone Encounter (Addendum)
Per separate encounter    Filled 10/27/22 at [facility] pharmacy

## 2022-10-30 NOTE — Telephone Encounter (Signed)
Noted, thanks!

## 2022-11-05 ENCOUNTER — Encounter: Payer: Self-pay | Admitting: Family

## 2022-11-05 NOTE — Telephone Encounter (Signed)
Does the referral needs to be changed or can the patient contact [facility]? See Mychart message.

## 2022-11-14 ENCOUNTER — Institutional Professional Consult (permissible substitution): Payer: 59 | Admitting: Primary Care

## 2022-11-22 ENCOUNTER — Other Ambulatory Visit: Payer: Self-pay | Admitting: Family

## 2022-11-22 DIAGNOSIS — R0602 Shortness of breath: Secondary | ICD-10-CM

## 2022-11-27 ENCOUNTER — Ambulatory Visit: Payer: 59

## 2022-12-26 ENCOUNTER — Telehealth: Payer: Self-pay

## 2022-12-26 ENCOUNTER — Other Ambulatory Visit (HOSPITAL_COMMUNITY): Payer: Self-pay

## 2022-12-26 NOTE — Telephone Encounter (Signed)
Patient Advocate Encounter   Received notification from [employer] that prior authorization is required for Fluticasone-Salmeterol 250-50MCG/ACT aerosol powder   Submitted: n/a [place] [id number]  PA not submitted at this time as it is not required if processed through for [facility]

## 2022-12-29 NOTE — Telephone Encounter (Signed)
Great so I don't have to do anything further?

## 2023-01-27 ENCOUNTER — Ambulatory Visit
Admission: RE | Admit: 2023-01-27 | Discharge: 2023-01-27 | Disposition: A | Discharge status: Home / Self Care | Payer: 59 | Source: Ambulatory Visit | Attending: Emergency Medicine | Admitting: Emergency Medicine

## 2023-01-27 VITALS — BP 153/85 | HR 85 | Temp 99.3°F | Resp 18

## 2023-01-27 DIAGNOSIS — J069 Acute upper respiratory infection, unspecified: Secondary | ICD-10-CM | POA: Insufficient documentation

## 2023-01-27 DIAGNOSIS — J683 Other acute and subacute respiratory conditions due to chemicals, gases, fumes and vapors: Secondary | ICD-10-CM | POA: Insufficient documentation

## 2023-01-27 DIAGNOSIS — Z20822 Contact with and (suspected) exposure to covid-19: Secondary | ICD-10-CM | POA: Diagnosis not present

## 2023-01-27 MED ORDER — FLUTICASONE PROPIONATE 50 MCG/ACT NA SUSP: 2.0000 | Freq: Every day | NASAL | 0 refills | Status: DC

## 2023-01-27 MED ORDER — PROMETHAZINE-DM 6.25-15 MG/5ML PO SYRP: 5.0000 mL | ORAL_SOLUTION | Freq: Four times a day (QID) | ORAL | 0 refills | Status: DC | PRN

## 2023-01-27 MED ORDER — ALBUTEROL SULFATE HFA 108 (90 BASE) MCG/ACT IN AERS: 1.0000 | INHALATION_SPRAY | RESPIRATORY_TRACT | 0 refills | Status: DC | PRN
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
## 2023-01-27 NOTE — Discharge Instructions (Signed)
Your COVID will be back in 6 to 24 hours.  You will need to wear a mask at all times around others for 10 days if your COVID is positive.  The CDC does not require quarantining for 5 days, but I would quarantine for a day or 2.  2 puffs from your albuterol inhaler using your spacer every 4 hours for 2 days, then every 6 hours for 2 days, then as needed.  He can back off the albuterol if you start to improve sooner.  Flonase, saline nasal irrigation with a [facility]er Med rinse and distilled water as often as you want.  Promethazine DM as needed for cough.

## 2023-01-27 NOTE — ED Triage Notes (Signed)
Patient presents to UC for sore throat, hoarseness, coughing, wheezing, fatigue x 1 day. States he has an inhaler but did not use it.

## 2023-01-28 LAB — SARS CORONAVIRUS 2 (TAT 6-24 HRS): SARS Coronavirus 2: NEGATIVE

## 2023-02-24 ENCOUNTER — Ambulatory Visit
Admission: EM | Admit: 2023-02-24 | Discharge: 2023-02-24 | Disposition: A | Discharge status: Home / Self Care | Payer: 59 | Attending: Emergency Medicine | Admitting: Emergency Medicine

## 2023-02-24 ENCOUNTER — Ambulatory Visit (INDEPENDENT_AMBULATORY_CARE_PROVIDER_SITE_OTHER): Payer: 59

## 2023-02-24 ENCOUNTER — Other Ambulatory Visit: Payer: Self-pay

## 2023-02-24 DIAGNOSIS — M1712 Unilateral primary osteoarthritis, left knee: Secondary | ICD-10-CM

## 2023-02-24 MED ORDER — PREDNISONE 10 MG (21) PO TBPK: ORAL_TABLET | Freq: Every day | ORAL | 0 refills | Status: DC
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
## 2023-02-24 NOTE — Discharge Instructions (Addendum)
X-ray shows tricompartmental degenerative changes which means arthritis  Stop use of meloxicam temporarily  Begin use of prednisone every morning as directed, take with food, this helps to reduce inflammation and internal help with your pain, may take Tylenol 500 to 1000 mg every 6 hours as needed in addition  If you are able to use compression do so to provide stability and support while pain is flared  Use heat over the affected area in 10 to 15-minute intervals to help reduce swelling and help with pain  When sitting and lying place pillows underneath the knee for support and for comfort  May continue activity as tolerated  If symptoms continue to persist or worsen may follow-up with urgent care or orthopedics for reevaluation and for further management

## 2023-02-24 NOTE — ED Triage Notes (Signed)
Pt is here with left knee pain for 4 days now, pt has taken Mexlocam(prescribed for his artithris) to relieve discomfort.

## 2023-02-26 ENCOUNTER — Other Ambulatory Visit: Payer: Self-pay

## 2023-02-26 ENCOUNTER — Emergency Department (HOSPITAL_BASED_OUTPATIENT_CLINIC_OR_DEPARTMENT_OTHER)
Admission: EM | Admit: 2023-02-26 | Discharge: 2023-02-26 | Disposition: A | Discharge status: Home / Self Care | Payer: 59 | Attending: Emergency Medicine | Admitting: Emergency Medicine

## 2023-02-26 ENCOUNTER — Emergency Department (HOSPITAL_BASED_OUTPATIENT_CLINIC_OR_DEPARTMENT_OTHER): Payer: 59

## 2023-02-26 ENCOUNTER — Encounter (HOSPITAL_BASED_OUTPATIENT_CLINIC_OR_DEPARTMENT_OTHER): Payer: Self-pay

## 2023-02-26 DIAGNOSIS — R030 Elevated blood-pressure reading, without diagnosis of hypertension: Secondary | ICD-10-CM

## 2023-02-26 DIAGNOSIS — Z79899 Other long term (current) drug therapy: Secondary | ICD-10-CM | POA: Diagnosis not present

## 2023-02-26 DIAGNOSIS — I10 Essential (primary) hypertension: Secondary | ICD-10-CM | POA: Insufficient documentation

## 2023-02-26 DIAGNOSIS — M1712 Unilateral primary osteoarthritis, left knee: Secondary | ICD-10-CM | POA: Diagnosis not present

## 2023-02-26 DIAGNOSIS — M25562 Pain in left knee: Secondary | ICD-10-CM

## 2023-02-26 MED ORDER — TRAMADOL HCL 50 MG PO TABS
50.0000 mg | ORAL_TABLET | Freq: Once | ORAL | Status: DC
  Filled 2023-02-26: qty 1

## 2023-02-26 MED ORDER — IBUPROFEN 400 MG PO TABS
400.0000 mg | ORAL_TABLET | Freq: Once | ORAL | Status: DC
  Filled 2023-02-26: qty 1

## 2023-02-26 MED ORDER — OXYCODONE-ACETAMINOPHEN 5-325 MG PO TABS: 1.0000 | ORAL_TABLET | Freq: Four times a day (QID) | ORAL | 0 refills | Status: DC | PRN

## 2023-02-26 NOTE — ED Notes (Signed)
Pt given discharge instructions and reviewed prescriptions. Opportunities given for questions. Pt verbalizes understanding. Stone,Heather R, RN 

## 2023-02-26 NOTE — Discharge Instructions (Addendum)
It was our pleasure to provide your ER care today - we hope that you feel better.  Complete the course of your prednisone.  Take ibuprofen or aleve as need. You may also take percocet as need for pain. No driving for the next 6 hours or when taking percocet. Also, do not take tylenol or acetaminophen containing medication when taking percocet.    Follow up with orthopedist in 1-2 weeks - call office to arrange appointment.  Return to ER if worse, new symptoms, fevers, increased redness/swelling, or other concern.

## 2023-02-26 NOTE — ED Provider Notes (Signed)
[place] [facility] AT [facility] Provider Note   CSN: [id number] Arrival date & time: 02/26/23  1216     History  Chief Complaint  Patient presents with   Knee Pain    Ernest Boyd is a 52 y.o. male.  Pt with c/o left knee pain. Recently went to urgent care for same, had xrays - did not want knee injection, was given rx prednisone and has a few days left.  Denies subsequent injury or strain. Was told significant arthritis in knee. Has not yet seen ortho. Pain worse w certain knee movements and walking. No leg swelling. No redness or skin changes to area. No hip or ankle pain. No leg numbness/weakness. No recent trauma or immobility. No leg swelling or calf pain. No chest pain or sob. No fever or chills. Has taken acetaminophen today but nothing else for pain.   The history is provided by the patient, a significant other and medical records.  Knee Pain Associated symptoms: no fever        Home Medications Prior to Admission medications   Medication Sig Start Date End Date Taking? Authorizing Provider  albuterol (VENTOLIN HFA) 108 (90 Base) MCG/ACT inhaler Inhale 1-2 puffs into the lungs every 4 (four) hours as needed for wheezing or shortness of breath. 01/27/23   Domenick Gong, MD  amLODipine (NORVASC) 5 MG tablet Take 1 tablet (5 mg total) by mouth daily. 09/19/22   Mort Sawyers, FNP  calcium carbonate (TUMS - DOSED IN MG ELEMENTAL CALCIUM) 500 MG chewable tablet Chew 1 tablet by mouth as needed for indigestion or heartburn.    [provider]  Evolocumab (REPATHA) 140 MG/ML SOSY Inject one injection sq every two weeks 10/24/22   Mort Sawyers, FNP  ezetimibe (ZETIA) 10 MG tablet Take 10 mg by mouth at bedtime. 06/20/21   [provider]  fluticasone (FLONASE) 50 MCG/ACT nasal spray Place 2 sprays into both nostrils daily. 01/27/23   Domenick Gong, MD  lisinopril (ZESTRIL) 10 MG tablet Take 1 tablet (10 mg total) by mouth every morning.  10/20/22 10/15/23  Mort Sawyers, FNP  meloxicam (MOBIC) 7.5 MG tablet Take 1 tablet (7.5 mg total) by mouth daily. 09/19/22   Mort Sawyers, FNP  methocarbamol (ROBAXIN) 500 MG tablet Take 1 tablet (500 mg total) by mouth 2 (two) times daily as needed for muscle spasms. 08/28/21   Mickie Bail, NP  predniSONE (STERAPRED UNI-PAK 21 TAB) 10 MG (21) TBPK tablet Take by mouth daily. Take 6 tabs by mouth daily  for 1 days, then 5 tabs for 1 days, then 4 tabs for 1 days, then 3 tabs for 1 days, 2 tabs for 1 days, then 1 tab by mouth daily for 1 days 02/24/23   Valinda Hoar, NP  promethazine-dextromethorphan (PROMETHAZINE-DM) 6.25-15 MG/5ML syrup Take 5 mLs by mouth 4 (four) times daily as needed for cough. 01/27/23   Domenick Gong, MD  Monte Fantasia INHUB 250-50 MCG/ACT AEPB INHALE 1 PUFF INTO THE LUNGS IN THE MORNING AND AT BEDTIME 11/24/22   Mort Sawyers, FNP      Allergies    Dilantin [phenytoin], Morphine and codeine, Nexlizet [bempedoic acid-ezetimibe], Simvastatin, and Vicodin hp [hydrocodone-acetaminophen]    Review of Systems   Review of Systems  Constitutional:  Negative for chills and fever.  Respiratory:  Negative for shortness of breath.   Cardiovascular:  Negative for chest pain.  Musculoskeletal:        Left knee pain  Skin:  Negative  for rash and wound.  Neurological:  Negative for weakness and numbness.    Physical Exam Updated Vital Signs BP (!) 145/94   Pulse 85   Temp 97.6 F (36.4 C)   Resp 16   SpO2 97%  Physical Exam Vitals and nursing note reviewed.  Constitutional:      Appearance: Normal appearance. He is well-developed.  HENT:     Head: Atraumatic.     Nose: Nose normal.     Mouth/Throat:     Mouth: Mucous membranes are moist.  Eyes:     General: No scleral icterus.    Conjunctiva/sclera: Conjunctivae normal.  Neck:     Trachea: No tracheal deviation.  Cardiovascular:     Rate and Rhythm: Normal rate and regular rhythm.     Pulses: Normal pulses.      Heart sounds: Normal heart sounds. No murmur heard.    No friction rub. No gallop.  Pulmonary:     Effort: Pulmonary effort is normal. No accessory muscle usage or respiratory distress.     Breath sounds: Normal breath sounds.  Musculoskeletal:        General: No swelling or tenderness.     Cervical back: Neck supple.     Right lower leg: No edema.     Left lower leg: No edema.     Comments: Left knee is grossly stable. No effusion noted. No erythema or increased warmth. Good passive rom at left hip, knee, ankle without significant pain. No LLE swelling. LLE is of normal color and warmth. Distal pulses palp.   Skin:    General: Skin is warm and dry.     Findings: No rash.  Neurological:     Mental Status: He is alert.     Comments: Alert, speech clear. LLE nvi w intact motor/sens fxn.   Psychiatric:        Mood and Affect: Mood normal.     ED Results / Procedures / Treatments   Labs (all labs ordered are listed, but only abnormal results are displayed) Labs Reviewed - No data to display  EKG None  Radiology US Venous Img Lower  Left (DVT Study)  Result Date: 02/26/2023 CLINICAL DATA:  Left lower extremity pain EXAM: LEFT LOWER EXTREMITY VENOUS DOPPLER ULTRASOUND TECHNIQUE: Gray-scale sonography with compression, as well as color and duplex ultrasound, were performed to evaluate the deep venous system(s) from the level of the common femoral vein through the popliteal and proximal calf veins. COMPARISON:  None Available. FINDINGS: VENOUS Normal compressibility of the common femoral, superficial femoral, and popliteal veins, as well as the visualized calf veins. Visualized portions of profunda femoral vein and great saphenous vein unremarkable. No filling defects to suggest DVT on grayscale or color Doppler imaging. Doppler waveforms show normal direction of venous flow, normal respiratory plasticity and response to augmentation. Limited views of the contralateral common femoral vein  are unremarkable. OTHER None. Limitations: none IMPRESSION: Negative. Electronically Signed   By: Malachy Moan M.D.   On: 02/26/2023 13:22    Procedures Procedures    Medications Ordered in ED Medications  ibuprofen (ADVIL) tablet 400 mg (has no administration in time range)  traMADol (ULTRAM) tablet 50 mg (50 mg Oral Not Given 02/26/23 1306)    ED Course/ Medical Decision Making/ A&P                                 Medical Decision Making  Problems Addressed: Acute pain of left knee: acute illness or injury Elevated blood pressure reading: acute illness or injury Essential hypertension: chronic illness or injury with exacerbation, progression, or side effects of treatment that poses a threat to life or bodily functions Primary osteoarthritis of left knee: chronic illness or injury with exacerbation, progression, or side effects of treatment  Amount and/or Complexity of Data Reviewed Independent Historian: EMS    Details: Fam, hx External Data Reviewed: notes. Radiology: ordered and independent interpretation performed. Decision-making details documented in ED Course.  Risk Prescription drug management.   Reviewed nursing notes and prior charts for additional history.   Ibuprofen po. Ultram po.  Imaging reviewed/interpreted by me - no dvt or bakers cyst.    Pt with recent plain film imaging with degenerative/arthritic changes.   Pt appears stable for d/c.   Pt indicates small quantity rx percocet for pain - provided.   Rec ortho f/u.           Final Clinical Impression(s) / ED Diagnoses Final diagnoses:  Acute pain of left knee  Elevated blood pressure reading  Essential hypertension  Primary osteoarthritis of left knee    Rx / DC Orders ED Discharge Orders     None         Cathren Laine, MD 02/26/23 1406

## 2023-02-26 NOTE — ED Triage Notes (Signed)
Pt c/o L knee pain, onset last Friday- reports he "aggravated it, hurting down the side; eased up Saturday but returned early week. UC advised arthritis was inflamed, was prescribed steroid & states that on day 3 of steroid, "pain just came back." Reports meloxicam hasn't helped.

## 2023-03-02 ENCOUNTER — Telehealth: Payer: Self-pay

## 2023-03-02 NOTE — Transitions of Care (Post Inpatient/ED Visit) (Signed)
Unable to reach pt by phone and left v/m requesting pt to cb [phone].      03/02/2023  Name: Ernest Boyd MRN: [id number] DOB: August 13, 1970  Today's TOC FU Call Status: Today's TOC FU Call Status:: Unsuccessful Call (1st Attempt) Unsuccessful Call (1st Attempt) Date: 03/02/23  Attempted to reach the patient regarding the most recent Inpatient/ED visit.  Follow Up Plan: Additional outreach attempts will be made to reach the patient to complete the Transitions of Care (Post Inpatient/ED visit) call.   Signature Lewanda Rife, LPN

## 2023-03-05 IMAGING — US US ABDOMEN LIMITED RUQ/ASCITES
1 series · 14 of 25 positions shown · non-contrast
Comparison: None.

CLINICAL DATA: Epigastric abdominal pain.

EXAM:
ULTRASOUND ABDOMEN LIMITED RIGHT UPPER QUADRANT

[Series 1: us abdomen limited ruq/ascites · 0.25mm/px · 14 of 53 slices shown]
[im 1/53]
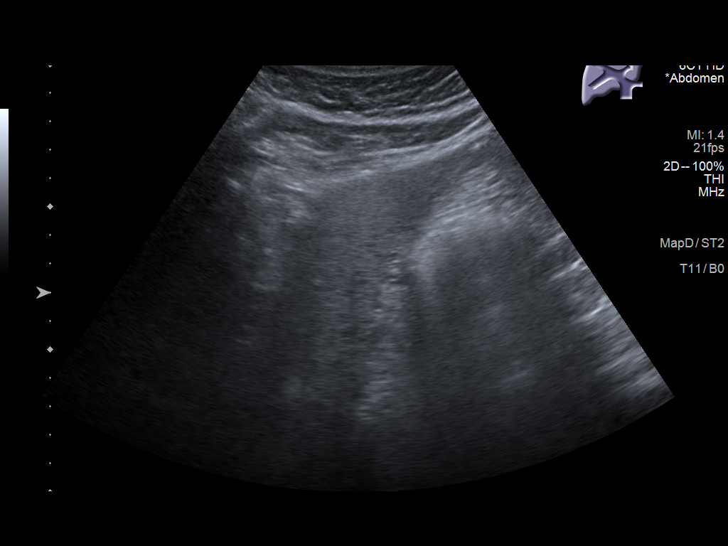
[im 5/53]
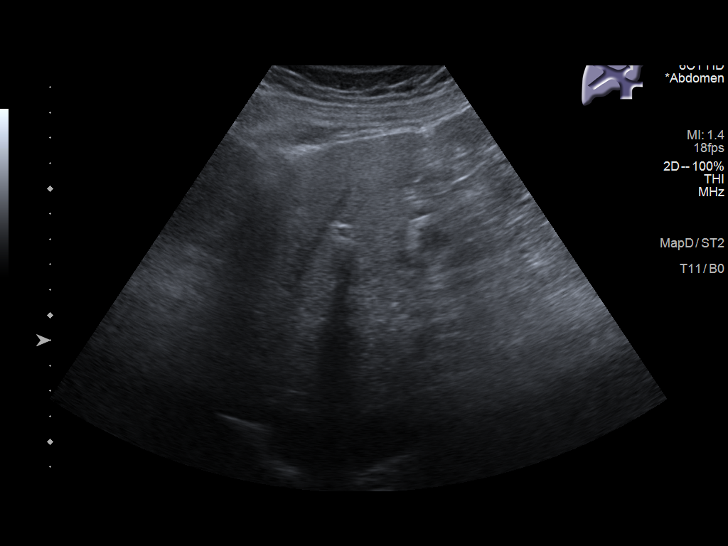
[im 9/53]
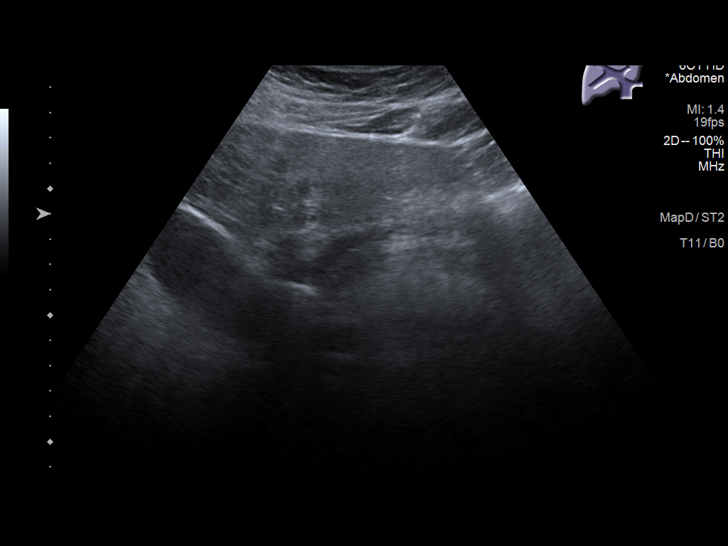
[im 14/53]
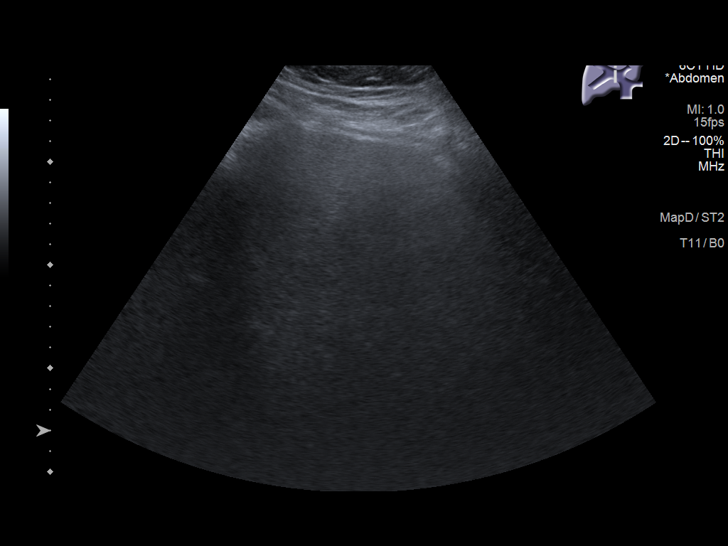
[im 18/53]
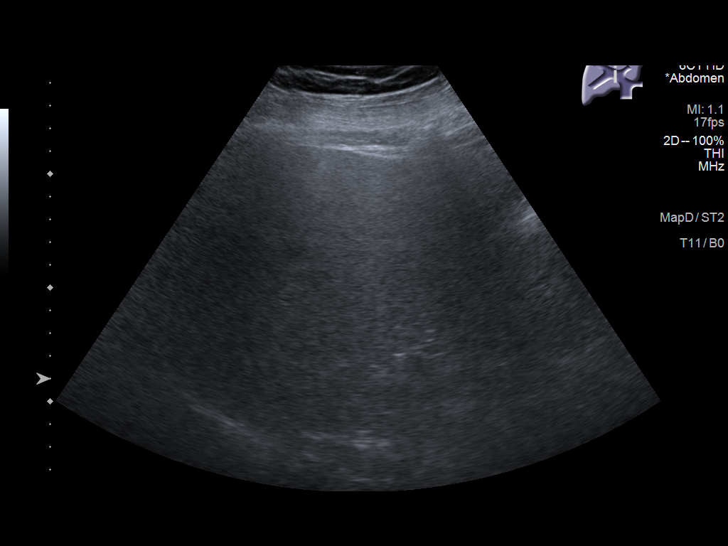
[im 20/53]
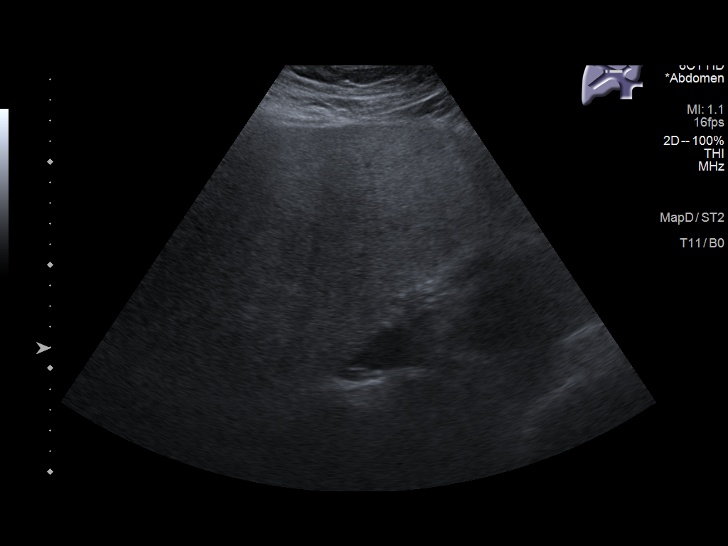
[im 24/53]
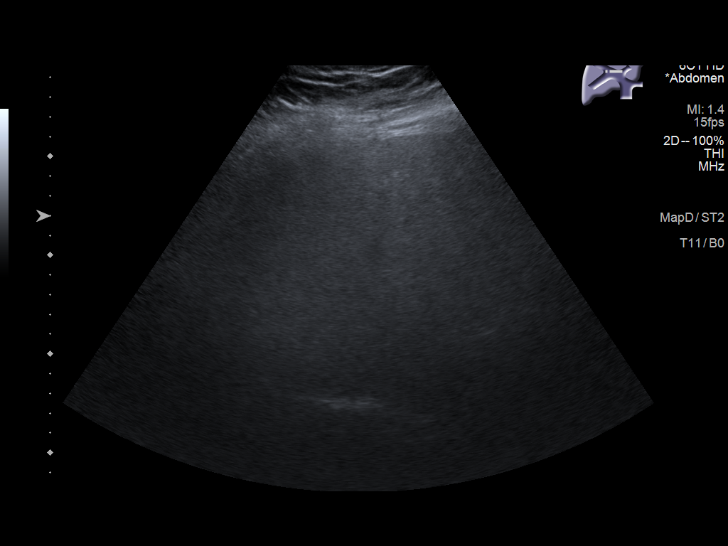
[im 29/53]
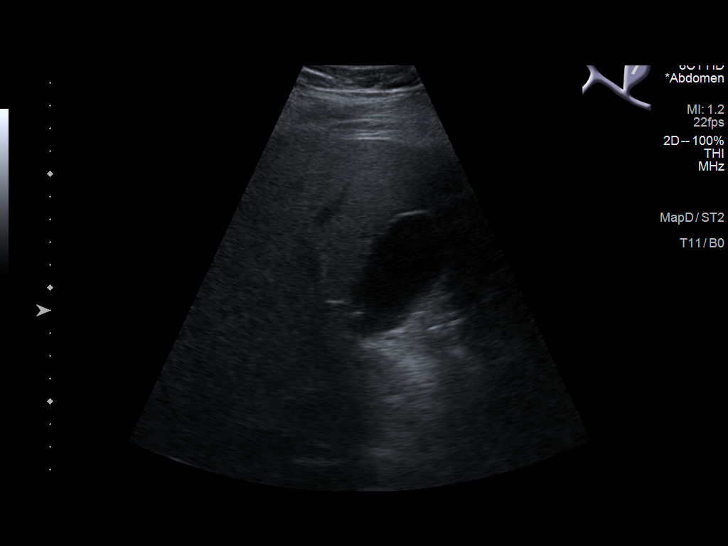
[im 33/53]
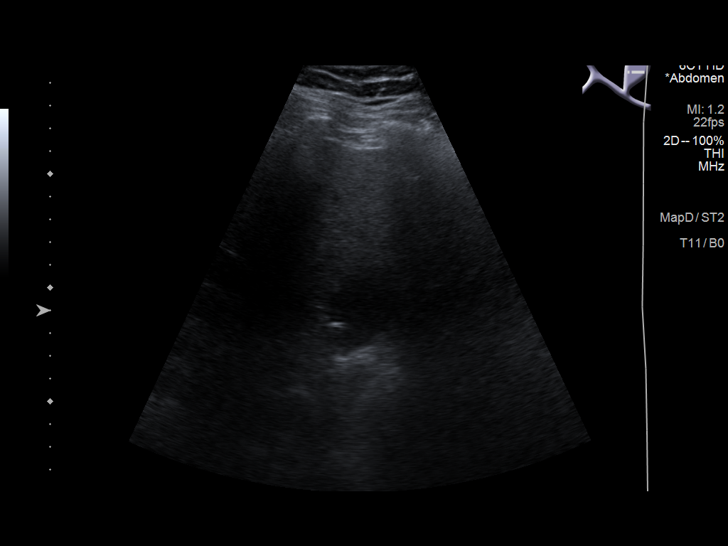
[im 35/53]
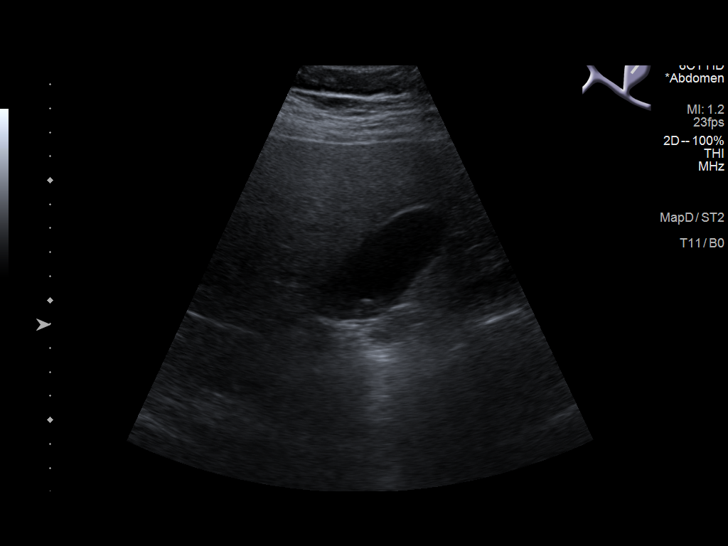
[im 40/53]
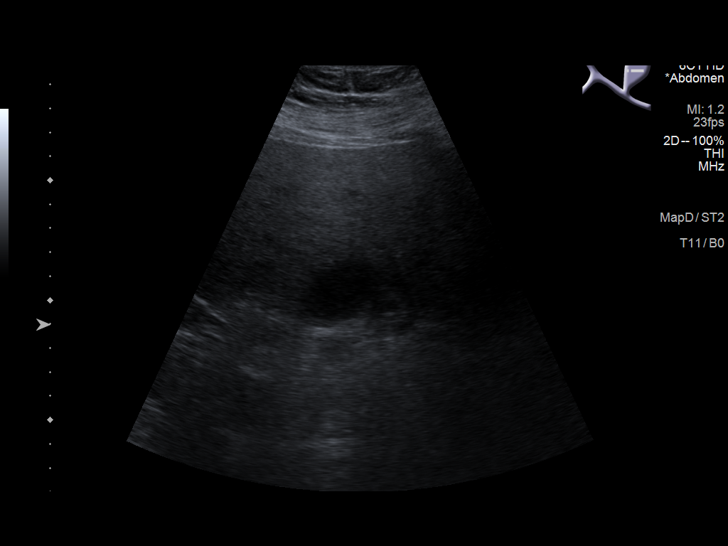
[im 44/53]
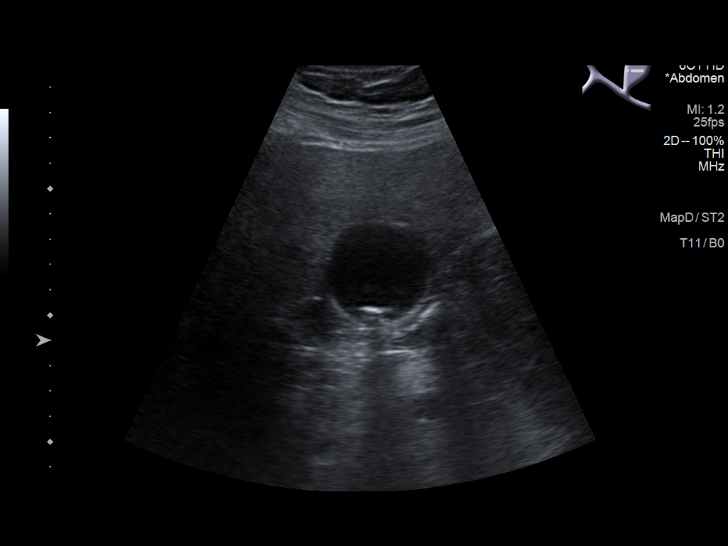
[im 48/53]
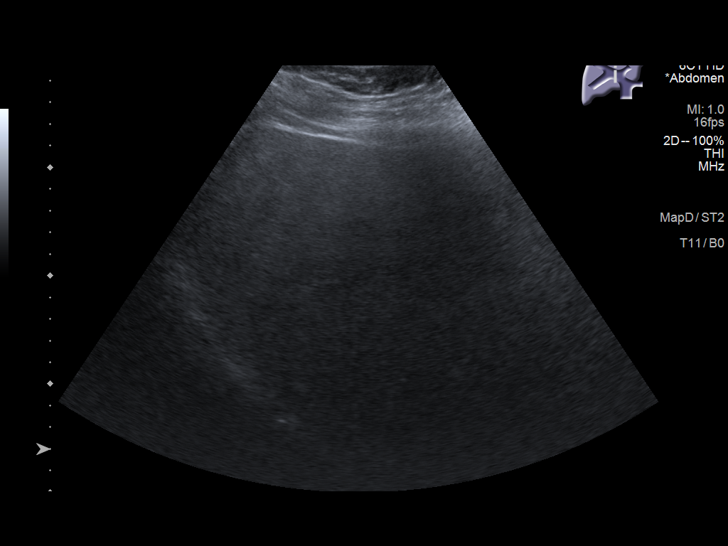
[im 53/53]
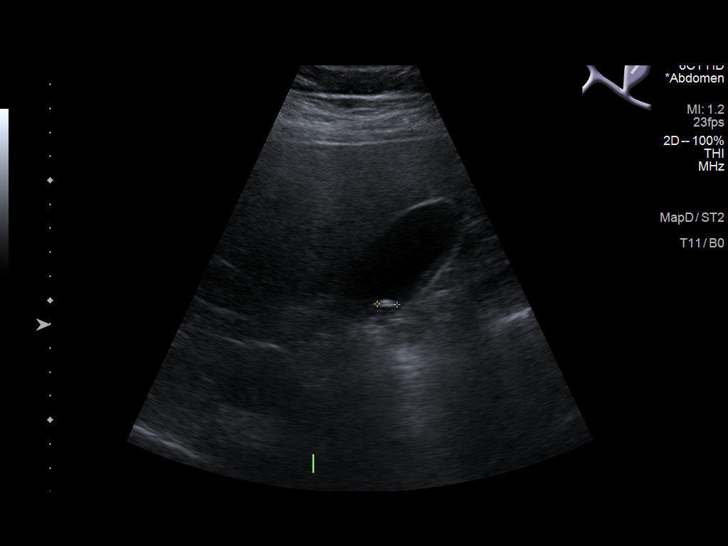

[14 of 25 positions shown; findings below may reference images not displayed]

FINDINGS: Gallbladder:

Cholelithiasis is noted without gallbladder wall thickening or
pericholecystic fluid. No sonographic Murphy's sign is noted.

Common bile duct:

Diameter: 2 mm which is within normal limits.

Liver:

No focal lesion identified. Increased echogenicity of hepatic
parenchyma is noted suggesting hepatic steatosis or other diffuse
hepatocellular disease. Portal vein is patent on color Doppler
imaging with normal direction of blood flow towards the liver.

Other: None.
IMPRESSION: Cholelithiasis without evidence of cholecystitis.

Increased echogenicity of hepatic parenchyma is noted suggesting
hepatic steatosis or other diffuse hepatocellular disease.

## 2023-03-23 ENCOUNTER — Ambulatory Visit: Payer: 59 | Admitting: Dermatology

## 2023-04-14 ENCOUNTER — Ambulatory Visit: Payer: 59 | Admitting: Family

## 2023-05-01 ENCOUNTER — Ambulatory Visit: Payer: 59 | Admitting: Family

## 2023-05-20 ENCOUNTER — Inpatient Hospital Stay (HOSPITAL_COMMUNITY)
Admission: EM | Admit: 2023-05-20 | Discharge: 2023-05-25 | DRG: 308 | Disposition: A | Discharge status: Home / Self Care | Payer: 59 | Attending: Internal Medicine | Admitting: Internal Medicine

## 2023-05-20 ENCOUNTER — Emergency Department (HOSPITAL_COMMUNITY): Payer: 59

## 2023-05-20 ENCOUNTER — Encounter (HOSPITAL_COMMUNITY): Payer: Self-pay

## 2023-05-20 ENCOUNTER — Ambulatory Visit: Payer: 59 | Admitting: Family

## 2023-05-20 ENCOUNTER — Other Ambulatory Visit: Payer: Self-pay

## 2023-05-20 ENCOUNTER — Encounter: Payer: Self-pay | Admitting: Family

## 2023-05-20 VITALS — BP 132/86 | HR 76 | Temp 99.3°F | Ht 71.0 in | Wt 243.8 lb

## 2023-05-20 DIAGNOSIS — I483 Typical atrial flutter: Secondary | ICD-10-CM | POA: Diagnosis present

## 2023-05-20 DIAGNOSIS — I480 Paroxysmal atrial fibrillation: Secondary | ICD-10-CM | POA: Diagnosis present

## 2023-05-20 DIAGNOSIS — B349 Viral infection, unspecified: Secondary | ICD-10-CM | POA: Diagnosis present

## 2023-05-20 DIAGNOSIS — E66811 Obesity, class 1: Secondary | ICD-10-CM | POA: Diagnosis present

## 2023-05-20 DIAGNOSIS — E6609 Other obesity due to excess calories: Secondary | ICD-10-CM | POA: Diagnosis present

## 2023-05-20 DIAGNOSIS — I110 Hypertensive heart disease with heart failure: Secondary | ICD-10-CM | POA: Diagnosis present

## 2023-05-20 DIAGNOSIS — K219 Gastro-esophageal reflux disease without esophagitis: Secondary | ICD-10-CM | POA: Diagnosis present

## 2023-05-20 DIAGNOSIS — Z888 Allergy status to other drugs, medicaments and biological substances status: Secondary | ICD-10-CM | POA: Diagnosis not present

## 2023-05-20 DIAGNOSIS — Z885 Allergy status to narcotic agent status: Secondary | ICD-10-CM | POA: Diagnosis not present

## 2023-05-20 DIAGNOSIS — Z87820 Personal history of traumatic brain injury: Secondary | ICD-10-CM | POA: Diagnosis not present

## 2023-05-20 DIAGNOSIS — E7800 Pure hypercholesterolemia, unspecified: Secondary | ICD-10-CM

## 2023-05-20 DIAGNOSIS — R000 Tachycardia, unspecified: Secondary | ICD-10-CM | POA: Insufficient documentation

## 2023-05-20 DIAGNOSIS — J209 Acute bronchitis, unspecified: Secondary | ICD-10-CM | POA: Insufficient documentation

## 2023-05-20 DIAGNOSIS — Z7901 Long term (current) use of anticoagulants: Secondary | ICD-10-CM | POA: Diagnosis not present

## 2023-05-20 DIAGNOSIS — J029 Acute pharyngitis, unspecified: Secondary | ICD-10-CM

## 2023-05-20 DIAGNOSIS — K7581 Nonalcoholic steatohepatitis (NASH): Secondary | ICD-10-CM | POA: Diagnosis present

## 2023-05-20 DIAGNOSIS — I4892 Unspecified atrial flutter: Secondary | ICD-10-CM | POA: Diagnosis not present

## 2023-05-20 DIAGNOSIS — E785 Hyperlipidemia, unspecified: Secondary | ICD-10-CM

## 2023-05-20 DIAGNOSIS — R051 Acute cough: Secondary | ICD-10-CM

## 2023-05-20 DIAGNOSIS — Z1152 Encounter for screening for COVID-19: Secondary | ICD-10-CM | POA: Diagnosis not present

## 2023-05-20 DIAGNOSIS — I4819 Other persistent atrial fibrillation: Secondary | ICD-10-CM

## 2023-05-20 DIAGNOSIS — Z6833 Body mass index (BMI) 33.0-33.9, adult: Secondary | ICD-10-CM

## 2023-05-20 DIAGNOSIS — I428 Other cardiomyopathies: Secondary | ICD-10-CM | POA: Diagnosis present

## 2023-05-20 DIAGNOSIS — I10 Essential (primary) hypertension: Secondary | ICD-10-CM | POA: Diagnosis present

## 2023-05-20 DIAGNOSIS — I4891 Unspecified atrial fibrillation: Secondary | ICD-10-CM | POA: Diagnosis not present

## 2023-05-20 DIAGNOSIS — E782 Mixed hyperlipidemia: Secondary | ICD-10-CM | POA: Diagnosis present

## 2023-05-20 DIAGNOSIS — I340 Nonrheumatic mitral (valve) insufficiency: Secondary | ICD-10-CM | POA: Diagnosis not present

## 2023-05-20 DIAGNOSIS — Z8249 Family history of ischemic heart disease and other diseases of the circulatory system: Secondary | ICD-10-CM | POA: Diagnosis not present

## 2023-05-20 DIAGNOSIS — R0602 Shortness of breath: Secondary | ICD-10-CM | POA: Diagnosis not present

## 2023-05-20 DIAGNOSIS — J00 Acute nasopharyngitis [common cold]: Secondary | ICD-10-CM | POA: Insufficient documentation

## 2023-05-20 DIAGNOSIS — I5021 Acute systolic (congestive) heart failure: Secondary | ICD-10-CM | POA: Diagnosis present

## 2023-05-20 DIAGNOSIS — I5020 Unspecified systolic (congestive) heart failure: Secondary | ICD-10-CM | POA: Diagnosis not present

## 2023-05-20 DIAGNOSIS — Z79899 Other long term (current) drug therapy: Secondary | ICD-10-CM

## 2023-05-20 LAB — CBC WITH DIFFERENTIAL/PLATELET
Abs Immature Granulocytes: 0.02 10*3/uL (ref 0.00–0.07)
Basophils Absolute: 0 10*3/uL (ref 0.0–0.1)
Basophils Relative: 0 %
Eosinophils Absolute: 0.5 10*3/uL (ref 0.0–0.5)
Eosinophils Relative: 5 %
HCT: 48.4 % (ref 39.0–52.0)
Hemoglobin: 16.4 g/dL (ref 13.0–17.0)
Immature Granulocytes: 0 %
Lymphocytes Relative: 15 %
Lymphs Abs: 1.4 10*3/uL (ref 0.7–4.0)
MCH: 28.1 pg (ref 26.0–34.0)
MCHC: 33.9 g/dL (ref 30.0–36.0)
MCV: 83 fL (ref 80.0–100.0)
Monocytes Absolute: 0.5 10*3/uL (ref 0.1–1.0)
Monocytes Relative: 6 %
Neutro Abs: 6.7 10*3/uL (ref 1.7–7.7)
Neutrophils Relative %: 74 %
Platelets: 267 10*3/uL (ref 150–400)
RBC: 5.83 MIL/uL — ABNORMAL HIGH (ref 4.22–5.81)
RDW: 12.7 % (ref 11.5–15.5)
WBC: 9.1 10*3/uL (ref 4.0–10.5)
nRBC: 0 % (ref 0.0–0.2)

## 2023-05-20 LAB — RESP PANEL BY RT-PCR (RSV, FLU A&B, COVID)  RVPGX2
Influenza A by PCR: NEGATIVE
Influenza B by PCR: NEGATIVE
Resp Syncytial Virus by PCR: NEGATIVE
SARS Coronavirus 2 by RT PCR: NEGATIVE

## 2023-05-20 LAB — COMPREHENSIVE METABOLIC PANEL
ALT: 23 U/L (ref 0–44)
AST: 19 U/L (ref 15–41)
Albumin: 4.1 g/dL (ref 3.5–5.0)
Alkaline Phosphatase: 55 U/L (ref 38–126)
Anion gap: 10 (ref 5–15)
BUN: 12 mg/dL (ref 6–20)
CO2: 23 mmol/L (ref 22–32)
Calcium: 9.6 mg/dL (ref 8.9–10.3)
Chloride: 102 mmol/L (ref 98–111)
Creatinine, Ser: 0.98 mg/dL (ref 0.61–1.24)
GFR, Estimated: >60 mL/min (ref >60)
Glucose, Bld: 108 mg/dL — ABNORMAL HIGH (ref 70–99)
Potassium: 3.8 mmol/L (ref 3.5–5.1)
Sodium: 135 mmol/L (ref 135–145)
Total Bilirubin: 2.1 mg/dL — ABNORMAL HIGH (ref <1.2)
Total Protein: 7.9 g/dL (ref 6.5–8.1)

## 2023-05-20 LAB — BRAIN NATRIURETIC PEPTIDE: B Natriuretic Peptide: 249.2 pg/mL — ABNORMAL HIGH (ref 0.0–100.0)

## 2023-05-20 LAB — TSH: TSH: 1.092 u[IU]/mL (ref 0.350–4.500)

## 2023-05-20 LAB — POC COVID19 BINAXNOW: SARS Coronavirus 2 Ag: NEGATIVE

## 2023-05-20 LAB — MRSA NEXT GEN BY PCR, NASAL: MRSA by PCR Next Gen: NOT DETECTED

## 2023-05-20 LAB — POCT RAPID STREP A (OFFICE): Rapid Strep A Screen: NEGATIVE

## 2023-05-20 MED ORDER — ACETAMINOPHEN 650 MG RE SUPP: 650.0000 mg | Freq: Four times a day (QID) | RECTAL | Status: DC | PRN

## 2023-05-20 MED ORDER — APIXABAN 5 MG PO TABS
5.0000 mg | ORAL_TABLET | Freq: Two times a day (BID) | ORAL | Status: DC
  Administered 2023-05-21 – 2023-05-25 (×9): 5 mg via ORAL
  Filled 2023-05-20 (×9): qty 1

## 2023-05-20 MED ORDER — DILTIAZEM HCL-DEXTROSE 125-5 MG/125ML-% IV SOLN (PREMIX)
5.0000 mg/h | INTRAVENOUS | Status: DC
  Administered 2023-05-20: 15 mg/h via INTRAVENOUS
  Administered 2023-05-20: 5 mg/h via INTRAVENOUS
  Administered 2023-05-21 (×2): 15 mg/h via INTRAVENOUS
  Filled 2023-05-20 (×4): qty 125

## 2023-05-20 MED ORDER — LEVALBUTEROL HCL 0.63 MG/3ML IN NEBU: 0.6300 mg | INHALATION_SOLUTION | Freq: Four times a day (QID) | RESPIRATORY_TRACT | Status: DC | PRN

## 2023-05-20 MED ORDER — SODIUM CHLORIDE 0.9 % IV SOLN: INTRAVENOUS | Status: DC

## 2023-05-20 MED ORDER — DILTIAZEM LOAD VIA INFUSION
10.0000 mg | Freq: Once | INTRAVENOUS | Status: AC
  Administered 2023-05-20: 10 mg via INTRAVENOUS
  Filled 2023-05-20: qty 10

## 2023-05-20 MED ORDER — HYDRALAZINE HCL 20 MG/ML IJ SOLN: 10.0000 mg | Freq: Four times a day (QID) | INTRAMUSCULAR | Status: DC | PRN

## 2023-05-20 MED ORDER — APIXABAN 5 MG PO TABS
10.0000 mg | ORAL_TABLET | Freq: Once | ORAL | Status: AC
  Administered 2023-05-20: 10 mg via ORAL
  Filled 2023-05-20: qty 2

## 2023-05-20 MED ORDER — ENOXAPARIN SODIUM 40 MG/0.4ML IJ SOSY: 40.0000 mg | PREFILLED_SYRINGE | INTRAMUSCULAR | Status: DC

## 2023-05-20 MED ORDER — ALBUTEROL SULFATE (2.5 MG/3ML) 0.083% IN NEBU: 2.5000 mg | INHALATION_SOLUTION | Freq: Four times a day (QID) | RESPIRATORY_TRACT | Status: DC | PRN

## 2023-05-20 MED ORDER — FLUTICASONE-SALMETEROL 250-50 MCG/ACT IN AEPB
1.0000 | INHALATION_SPRAY | Freq: Two times a day (BID) | RESPIRATORY_TRACT | 3 refills | Status: DC
Start: 2023-05-20 — End: 2023-06-18

## 2023-05-20 MED ORDER — ACETAMINOPHEN 325 MG PO TABS
650.0000 mg | ORAL_TABLET | Freq: Four times a day (QID) | ORAL | Status: DC | PRN
  Administered 2023-05-20: 650 mg via ORAL
  Filled 2023-05-20: qty 2

## 2023-05-20 MED ORDER — APIXABAN 5 MG PO TABS: 10.0000 mg | ORAL_TABLET | Freq: Two times a day (BID) | ORAL | Status: DC

## 2023-05-20 NOTE — ED Notes (Signed)
Patient transported to X-ray 
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
## 2023-05-20 NOTE — Assessment & Plan Note (Addendum)
EKG in office with tachycardia as well as feeling clammy, with chest tightness intermittently.  EKG with Afib and tachy at 160 bpm. Manual bpm 140  Stable appearing, but uncomfortable and clammy with sob.  EMS called, and pt will go to hospital via EMS  Suspect ddx dehydration , stemi
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
## 2023-05-20 NOTE — ED Notes (Signed)
Informed RN of monitor alerts ?

## 2023-05-20 NOTE — ED Triage Notes (Signed)
Pt BIB [facility] from [facility] d/t them finding him in A-Fib at rate of 159 bpm. Pt reports he initially went there d/t the past couple of days feeling achy, having a fever & coughing. EMS reports on their monitor he was ranging from 120-180 bpm & confirmed he was in A-Fib. Pt denies any CP or palpitations, A/Ox4, 20g PIV in Rt hand & EMS V/S: 149/118, 96% on RA.

## 2023-05-20 NOTE — Assessment & Plan Note (Addendum)
Stable today however heart rate elevated, on amlodipine and lisinopril 10 mg once daily.
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
## 2023-05-20 NOTE — Progress Notes (Signed)
Established Patient Office Visit  Subjective:   Patient ID: Ernest Boyd, male    DOB: 24-Aug-1970  Age: 52 y.o. MRN: [id number]  CC:  Chief Complaint  Patient presents with   Acute Visit    Reports cough, fever, body aches, headache and sore throat x4 days. Has been having increased shortness of breath on exertion as well.    HPI: Ernest Boyd is a 52 y.o. male presenting on 05/20/2023 for Acute Visit (Reports cough, fever, body aches, headache and sore throat x4 days. Has been having increased shortness of breath on exertion as well.)   C/o five days of body aches, fever (up to 101) chest congestion, productive cough, headache and sore throat. Feels chest tightness that makes it hard for him to breath fully. Feeling more sob than normal.  Tried to use albuterol without any relief.  Ran out of wixela, and has not had recently.   Last fever was yesterday. Using otc tylenol with mild relief.       ROS: Negative unless specifically indicated above in HPI.   Relevant past medical history reviewed and updated as indicated.   Allergies and medications reviewed and updated.   Current Outpatient Medications:    albuterol (VENTOLIN HFA) 108 (90 Base) MCG/ACT inhaler, Inhale 1-2 puffs into the lungs every 4 (four) hours as needed for wheezing or shortness of breath., Disp: 1 each, Rfl: 0   amLODipine (NORVASC) 5 MG tablet, Take 1 tablet (5 mg total) by mouth daily., Disp: 90 tablet, Rfl: 3   calcium carbonate (TUMS - DOSED IN MG ELEMENTAL CALCIUM) 500 MG chewable tablet, Chew 1 tablet by mouth as needed for indigestion or heartburn., Disp: , Rfl:    Evolocumab (REPATHA) 140 MG/ML SOSY, Inject one injection sq every two weeks, Disp: 2 mL, Rfl: 5   ezetimibe (ZETIA) 10 MG tablet, Take 10 mg by mouth at bedtime., Disp: , Rfl:    fluticasone (FLONASE) 50 MCG/ACT nasal spray, Place 2 sprays into both nostrils daily., Disp: 16 g, Rfl: 0   lisinopril (ZESTRIL) 10 MG tablet, Take 1 tablet (10  mg total) by mouth every morning., Disp: 90 tablet, Rfl: 3   meloxicam (MOBIC) 7.5 MG tablet, Take 1 tablet (7.5 mg total) by mouth daily., Disp: 90 tablet, Rfl: 2   methocarbamol (ROBAXIN) 500 MG tablet, Take 1 tablet (500 mg total) by mouth 2 (two) times daily as needed for muscle spasms., Disp: 10 tablet, Rfl: 0   fluticasone-salmeterol (WIXELA INHUB) 250-50 MCG/ACT AEPB, Inhale 1 puff into the lungs 2 (two) times daily. in the morning and at bedtime., Disp: 60 each, Rfl: 3  Allergies  Allergen Reactions   Dilantin [Phenytoin] Rash   Morphine And Codeine Nausea And Vomiting   Nexlizet [Bempedoic Acid-Ezetimibe] Other (See Comments)    myalgias   Simvastatin Other (See Comments)    Myalgias extreme per pt could hardly get out of bed   Vicodin Hp [Hydrocodone-Acetaminophen] Nausea And Vomiting    Objective:   BP 132/86   Pulse 76   Temp 99.3 F (37.4 C) (Oral)   Ht 5\' 11"  (1.803 m)   Wt 243 lb 12.8 oz (110.6 kg)   SpO2 96%   BMI 34.00 kg/m    Physical Exam Vitals reviewed.  Constitutional:      General: He is not in acute distress.    Appearance: Normal appearance. He is obese. He is not ill-appearing, toxic-appearing or diaphoretic.  HENT:     Head: Normocephalic.  Right Ear: Tympanic membrane normal.     Left Ear: Tympanic membrane normal.     Nose: Nose normal.     Mouth/Throat:     Mouth: Mucous membranes are moist.  Eyes:     Pupils: Pupils are equal, round, and reactive to light.  Cardiovascular:     Rate and Rhythm: Tachycardia present. Rhythm irregularly irregular. Extrasystoles are present. Pulmonary:     Effort: Pulmonary effort is normal.     Breath sounds: Normal breath sounds. No decreased air movement. No wheezing.  Musculoskeletal:        General: Normal range of motion.     Cervical back: Normal range of motion.  Neurological:     General: No focal deficit present.     Mental Status: He is alert and oriented to person, place, and time. Mental  status is at baseline.  Psychiatric:        Mood and Affect: Mood normal.        Behavior: Behavior normal.        Thought Content: Thought content normal.        Judgment: Judgment normal.     Assessment & Plan:  Acute cough -     POC COVID-19 BinaxNow  Sore throat -     POCT rapid strep A  Shortness of breath -     Fluticasone-Salmeterol; Inhale 1 puff into the lungs 2 (two) times daily. in the morning and at bedtime.  Dispense: 60 each; Refill: 3  Tachycardia Assessment & Plan: EKG in office with tachycardia as well as feeling clammy, with chest tightness intermittently.  EKG with Afib and tachy at 160 bpm. Manual bpm 140  Stable appearing, but uncomfortable and clammy with sob.  EMS called, and pt will go to hospital via EMS  Suspect ddx dehydration , stemi   Orders: -     EKG 12-Lead  Acute nasopharyngitis Assessment & Plan: Covid flu and strep negative.  Suspected to be causing upper respiratory symptoms however at this time AFIB with tachycardia, and pt being transported to hospital via EMS. Stable upon leaving.      Primary hypertension Assessment & Plan: Stable today however heart rate elevated, on amlodipine and lisinopril 10 mg once daily.        Follow up plan: No follow-ups on file.  Mort Sawyers, FNP

## 2023-05-20 NOTE — Assessment & Plan Note (Signed)
Covid flu and strep negative.  Suspected to be causing upper respiratory symptoms however at this time AFIB with tachycardia, and pt being transported to hospital via EMS. Stable upon leaving.

## 2023-05-21 ENCOUNTER — Inpatient Hospital Stay (HOSPITAL_COMMUNITY): Payer: 59 | Admitting: Certified Registered"

## 2023-05-21 ENCOUNTER — Inpatient Hospital Stay (HOSPITAL_COMMUNITY): Payer: 59

## 2023-05-21 ENCOUNTER — Encounter (HOSPITAL_COMMUNITY)
Admission: EM | Disposition: A | Discharge status: Home / Self Care | Payer: Self-pay | Source: Home / Self Care | Attending: Internal Medicine

## 2023-05-21 DIAGNOSIS — I4892 Unspecified atrial flutter: Secondary | ICD-10-CM | POA: Diagnosis present

## 2023-05-21 DIAGNOSIS — I480 Paroxysmal atrial fibrillation: Secondary | ICD-10-CM | POA: Diagnosis not present

## 2023-05-21 DIAGNOSIS — I483 Typical atrial flutter: Secondary | ICD-10-CM

## 2023-05-21 DIAGNOSIS — I340 Nonrheumatic mitral (valve) insufficiency: Secondary | ICD-10-CM

## 2023-05-21 DIAGNOSIS — J209 Acute bronchitis, unspecified: Secondary | ICD-10-CM | POA: Insufficient documentation

## 2023-05-21 DIAGNOSIS — I10 Essential (primary) hypertension: Secondary | ICD-10-CM | POA: Diagnosis not present

## 2023-05-21 DIAGNOSIS — I4891 Unspecified atrial fibrillation: Secondary | ICD-10-CM | POA: Diagnosis not present

## 2023-05-21 HISTORY — PX: TRANSESOPHAGEAL ECHOCARDIOGRAM (CATH LAB): EP1270

## 2023-05-21 LAB — BASIC METABOLIC PANEL
Anion gap: 16 — ABNORMAL HIGH (ref 5–15)
BUN: 13 mg/dL (ref 6–20)
CO2: 21 mmol/L — ABNORMAL LOW (ref 22–32)
Calcium: 9.3 mg/dL (ref 8.9–10.3)
Chloride: 100 mmol/L (ref 98–111)
Creatinine, Ser: 1 mg/dL (ref 0.61–1.24)
GFR, Estimated: >60 mL/min (ref >60)
Glucose, Bld: 113 mg/dL — ABNORMAL HIGH (ref 70–99)
Potassium: 4.1 mmol/L (ref 3.5–5.1)
Sodium: 137 mmol/L (ref 135–145)

## 2023-05-21 LAB — LIPID PANEL
Cholesterol: 172 mg/dL (ref 0–200)
HDL: 36 mg/dL — ABNORMAL LOW (ref >40)
LDL Cholesterol: 119 mg/dL — ABNORMAL HIGH (ref 0–99)
Total CHOL/HDL Ratio: 4.8 {ratio}
Triglycerides: 85 mg/dL (ref <150)
VLDL: 17 mg/dL (ref 0–40)

## 2023-05-21 LAB — HIV ANTIBODY (ROUTINE TESTING W REFLEX): HIV Screen 4th Generation wRfx: NONREACTIVE

## 2023-05-21 LAB — CBC
HCT: 44.2 % (ref 39.0–52.0)
Hemoglobin: 15.2 g/dL (ref 13.0–17.0)
MCH: 28.3 pg (ref 26.0–34.0)
MCHC: 34.4 g/dL (ref 30.0–36.0)
MCV: 82.2 fL (ref 80.0–100.0)
Platelets: 236 10*3/uL (ref 150–400)
RBC: 5.38 MIL/uL (ref 4.22–5.81)
RDW: 12.7 % (ref 11.5–15.5)
WBC: 7.4 10*3/uL (ref 4.0–10.5)
nRBC: 0 % (ref 0.0–0.2)

## 2023-05-21 LAB — ECHO TEE: S' Lateral: 3.4 cm

## 2023-05-21 SURGERY — TRANSESOPHAGEAL ECHOCARDIOGRAM (TEE) (CATHLAB)
Anesthesia: Monitor Anesthesia Care

## 2023-05-21 MED ORDER — GLYCOPYRROLATE 0.2 MG/ML IJ SOLN
INTRAMUSCULAR | Status: DC | PRN
  Administered 2023-05-21: .1 mg via INTRAVENOUS

## 2023-05-21 MED ORDER — PHENYLEPHRINE 80 MCG/ML (10ML) SYRINGE FOR IV PUSH (FOR BLOOD PRESSURE SUPPORT)
PREFILLED_SYRINGE | INTRAVENOUS | Status: DC | PRN
  Administered 2023-05-21 (×2): 80 ug via INTRAVENOUS

## 2023-05-21 MED ORDER — ORAL CARE MOUTH RINSE: 15.0000 mL | OROMUCOSAL | Status: DC | PRN

## 2023-05-21 MED ORDER — DILTIAZEM HCL ER COATED BEADS 120 MG PO CP24
120.0000 mg | ORAL_CAPSULE | Freq: Every day | ORAL | Status: DC
  Administered 2023-05-21: 120 mg via ORAL
  Filled 2023-05-21: qty 1

## 2023-05-21 MED ORDER — LIDOCAINE HCL 4 % EX SOLN
CUTANEOUS | Status: DC | PRN
Start: 2023-05-21 — End: 2023-05-21
  Administered 2023-05-21: 3 mL via TOPICAL

## 2023-05-21 MED ORDER — PROPOFOL 10 MG/ML IV BOLUS
INTRAVENOUS | Status: DC | PRN
  Administered 2023-05-21: 100 mg via INTRAVENOUS

## 2023-05-21 MED ORDER — LIDOCAINE 2% (20 MG/ML) 5 ML SYRINGE
INTRAMUSCULAR | Status: DC | PRN
  Administered 2023-05-21: 40 mg via INTRAVENOUS

## 2023-05-21 MED ORDER — ONDANSETRON HCL 4 MG/2ML IJ SOLN
INTRAMUSCULAR | Status: DC | PRN
  Administered 2023-05-21: 4 mg via INTRAVENOUS

## 2023-05-21 MED ORDER — PROPOFOL 500 MG/50ML IV EMUL
INTRAVENOUS | Status: DC | PRN
  Administered 2023-05-21: 200 ug/kg/min via INTRAVENOUS

## 2023-05-21 SURGICAL SUPPLY — 1 items: PAD DEFIB RADIO PHYSIO CONN (PAD) ×1 IMPLANT

## 2023-05-21 NOTE — Interval H&P Note (Signed)
History and Physical Interval Note:  05/21/2023 12:47 PM  Ernest Boyd  has presented today for surgery, with the diagnosis of aflutter.  The various methods of treatment have been discussed with the patient and family. After consideration of risks, benefits and other options for treatment, the patient has consented to  Procedure(s): TRANSESOPHAGEAL ECHOCARDIOGRAM (N/A) CARDIOVERSION (CATH LAB) (N/A) as a surgical intervention.  The patient's history has been reviewed, patient examined, no change in status, stable for surgery.  I have reviewed the patient's chart and labs.  Questions were answered to the patient's satisfaction.    After careful review of history and examination, the risks, benefits of transesophageal echocardiogram, and alternatives have been explained to the patient. Complications include but not limited to esophageal perforation (rare), gastrointestinal bleeding (rare), cardiac arrhythmia which can include cardiac arrest and death (rare), pharyngeal irritation / discomfort with swallowing / hematoma, methemoglobinemia, bronchospasm, transient hypoxia, nonsustained ventricular tachycardia, transient atrial fibrillation, minimal hemoptysis, vomiting, hypotension, respiratory compromise, reaction to medications, unavoidable damage to teeth and gums, aspiration pneumonia  were reviewed with the patient.  Patient voices understands, provides verbal feedback, questions answered, and patient wishes to proceed with the procedure.  Risks, benefits, and alternatives of direct current cardioversion reviewed with the and patient. Risk includes but not limited to: potential for post-cardioversion rhythms, life-threatening arrhythmias (ventricular tachycardia and fibrillation, profound bradycardia, cardiac arrest), myocardial damage, acute pulmonary edema, skin burns, transient hypotension. Benefits include restoration of sinus rhythm. Alternatives to treatment were discussed, questions were answered,  patient voices understanding and provides verbal feedback.  Patient is willing to proceed.   Tessa Lerner, DO, [facility]  [facility]  [address] [place], [place] [address] 12:48 PM 05/21/23
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
## 2023-05-21 NOTE — H&P (View-Only) (Signed)
Rounding Note    Patient Name: Ernest Boyd Date of Encounter: 05/21/2023  [facility] Cardiologist: None   Subjective   He feels well this AM. Pending TEE/DCCV  Inpatient Medications    Scheduled Meds:  apixaban  5 mg Oral BID   Continuous Infusions:  sodium chloride 20 mL/hr at 05/21/23 0800   diltiazem (CARDIZEM) infusion 15 mg/hr (05/21/23 0800)   PRN Meds: acetaminophen **OR** acetaminophen, hydrALAZINE, levalbuterol   Vital Signs    Vitals:   05/21/23 0015 05/21/23 0326 05/21/23 0720 05/21/23 0800  BP: (!) 118/92 120/87 122/85   Pulse: (!) 102 96 (!) 103 84  Resp: 18 16 16    Temp: 98.3 F (36.8 C) 98.4 F (36.9 C) 98.4 F (36.9 C)   TempSrc: Oral Oral Oral   SpO2: 95% 93% 92% 93%  Weight:      Height:        Intake/Output Summary (Last 24 hours) at 05/21/2023 0848 Last data filed at 05/21/2023 0800 Gross per 24 hour  Intake 572.56 ml  Output 900 ml  Net -327.44 ml      05/20/2023    6:20 PM 05/20/2023    1:31 PM 05/20/2023   11:35 AM  Last 3 Weights  Weight (lbs) 240 lb 1.6 oz 243 lb 243 lb 12.8 oz  Weight (kg) 108.909 kg 110.224 kg 110.587 kg      Telemetry    Rate controlled afib - Personally Reviewed  ECG    No new - Personally Reviewed  Physical Exam   Vitals:   05/21/23 0720 05/21/23 0800  BP: 122/85   Pulse: (!) 103 84  Resp: 16   Temp: 98.4 F (36.9 C)   SpO2: 92% 93%    GEN: No acute distress.   Neck: No JVD Cardiac: IRRR, no murmurs, rubs, or gallops.  Respiratory: Clear to auscultation bilaterally. GI: Soft, nontender, non-distended  MS: No edema; No deformity. Neuro:  Nonfocal  Psych: Normal affect   Labs    High Sensitivity Troponin:  No results for input(s): "TROPONINIHS" in the last 720 hours.   Chemistry Recent Labs  Lab 05/20/23 1341 05/21/23 0227  NA 135 137  K 3.8 4.1  CL 102 100  CO2 23 21*  GLUCOSE 108* 113*  BUN 12 13  CREATININE 0.98 1.00  CALCIUM 9.6 9.3  PROT 7.9  --   ALBUMIN  4.1  --   AST 19  --   ALT 23  --   ALKPHOS 55  --   BILITOT 2.1*  --   GFRNONAA >60 >60  ANIONGAP 10 16*    Lipids  Recent Labs  Lab 05/21/23 0227  CHOL 172  TRIG 85  HDL 36*  LDLCALC 119*  CHOLHDL 4.8    Hematology Recent Labs  Lab 05/20/23 1341 05/21/23 0227  WBC 9.1 7.4  RBC 5.83* 5.38  HGB 16.4 15.2  HCT 48.4 44.2  MCV 83.0 82.2  MCH 28.1 28.3  MCHC 33.9 34.4  RDW 12.7 12.7  PLT 267 236   Thyroid  Recent Labs  Lab 05/20/23 1843  TSH 1.092    BNP Recent Labs  Lab 05/20/23 1341  BNP 249.2*    DDimer No results for input(s): "DDIMER" in the last 168 hours.   Radiology    DG Chest 2 View  Result Date: 05/20/2023 CLINICAL DATA:  Shortness of breath, fever cough EXAM: CHEST - 2 VIEW COMPARISON:  09/29/2022 FINDINGS: The heart size and mediastinal contours are  within normal limits. Both lungs are clear. The visualized skeletal structures are unremarkable. IMPRESSION: No active cardiopulmonary disease.  Overall stable exam. Electronically Signed   By: Judie Petit.  Shick M.D.   On: 05/20/2023 16:49    Cardiac Studies   Pending TEE  Patient Profile     Ernest Boyd is a 52 y.o. male with a hx of HTN, HLD statin intolerant, who is being seen 05/20/2023 for the evaluation of aflutter at the request of Dr. Pola Corn. Mr. Roanhorse has no prior history of cardiovascular disease.   Assessment & Plan    Paroxysmal Atrial Fibrillation He has been dealing with an ongoing bronchitis.  He presents here with atrial fibrillation/flutter with RVR.  He has no signs of an acute infection or heart failure.  No ACS.  He denies apneic events.  I agree with assessing thyroid function.  His rates are still quite high on the diltiazem drip.   -Will therefore plan to give him Eliquis 10 mg tonight and start 5 mg twice daily tomorrow.  He will require anticoagulation for at least 4 weeks post cardioversion.  With CHA2DS2-VASc equal to 1 he will not need to continue anticoagulation after this  point. -TEE/DCCV today - continue eliquis -will transition to oral diltiazem 120 mg post DCCV, he then can go home if he converts to NSR.      Time Spent Directly with Patient:  I have spent a total of 35 minutes with the patient reviewing hospital notes, telemetry, EKGs, labs and examining the patient as well as establishing an assessment and plan that was discussed personally with the patient.  > 50% of time was spent in direct patient care.   For questions or updates, please contact [facility] Please consult [email or web address] for contact info under        Signed, Maisie Fus, MD  05/21/2023, 8:48 AM

## 2023-05-21 NOTE — Progress Notes (Signed)
Mobility Specialist Progress Note:   05/21/23 0935  Mobility  Activity Ambulated with assistance in hallway  Level of Assistance Standby assist, set-up cues, supervision of patient - no hands on  Assistive Device None  Distance Ambulated (ft) 350 ft  Activity Response Tolerated well  Mobility Referral Yes  $Mobility charge 1 Mobility  Mobility Specialist Start Time (ACUTE ONLY) 0935  Mobility Specialist Stop Time (ACUTE ONLY) 0945  Mobility Specialist Time Calculation (min) (ACUTE ONLY) 10 min    Pre Mobility: HR 90s AFib During Mobility: HR peak 143 AFib Post Mobility: HR 100 Afib  Pt agreeable to mobility session. Required no physical assistance throughout. HR up to 143bpm in Afib. Pt asx throughout. Back in bed with all needs met.   Addison Lank Mobility Specialist Please contact via SecureChat or  Rehab office at [phone]

## 2023-05-21 NOTE — Transfer of Care (Signed)
Immediate Anesthesia Transfer of Care Note  Patient: Ernest Boyd  Procedure(s) Performed: TRANSESOPHAGEAL ECHOCARDIOGRAM CARDIOVERSION (CATH LAB)  Patient Location: PACU  Anesthesia Type:MAC  Level of Consciousness: awake, alert , and oriented  Airway & Oxygen Therapy: Patient Spontanous Breathing and Patient connected to nasal cannula oxygen  Post-op Assessment: Report given to RN and Post -op Vital signs reviewed and stable  Post vital signs: Reviewed and stable  Last Vitals:  Vitals Value Taken Time  BP 130/103 05/21/23 1436  Temp    Pulse 136 05/21/23 1436  Resp 28 05/21/23 1436  SpO2 94 % 05/21/23 1436  Vitals shown include unfiled device data.  Last Pain:  Vitals:   05/21/23 1049  TempSrc: Oral  PainSc:          Complications: No notable events documented.
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
## 2023-05-21 NOTE — Discharge Instructions (Signed)

## 2023-05-21 NOTE — Plan of Care (Signed)
Unfortunately Mr. 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  So he was unable to undergo cardioversion.  Will plan to get a surface echocardiogram now that his rates are better controlled.  I will switch to him to oral diltiazem.  If he continues to be asymptomatic, he can likely go home today as long as he does not have a significantly reduced EF on his echocardiogram.  I will plan to schedule him to return for a TEE and cardioversion in 3 weeks.

## 2023-05-21 NOTE — Plan of Care (Signed)

## 2023-05-21 NOTE — CV Procedure (Signed)
    TRANSESOPHAGEAL ECHOCARDIOGRAM   NAME:  Ernest Boyd    MRN: [id number] DOB:  28-Apr-1971    ADMIT DATE: 05/20/2023  INDICATIONS: Atrial fibrillation with rapid ventricular rate  PROCEDURE:   Informed consent was obtained prior to the procedure. The risks, benefits and alternatives for the procedure were discussed and the patient comprehended these risks.  Risks include, but are not limited to, cough, sore throat, vomiting, nausea, somnolence, esophageal and stomach trauma or perforation, bleeding, low blood pressure, aspiration, pneumonia, infection, trauma to the teeth and death.    Procedural time out performed. The oropharynx was anesthetized.  Anesthesia was administered by anesthesia team (see their records).  The transesophageal probe was inserted in the esophagus and stomach without difficulty and multiple views were obtained.   COMPLICATIONS:    There were no immediate complications.  KEY FINDINGS:  Visually LVEF 40-45%, recommend repeating a limited echocardiogram either when in sinus rhythm or ventricular rate is better controlled to reevaluate LVEF.   Spontaneous echo contrast noted within the left atrium and left atrial appendage.  Echodensity noted within the left atrial appendage concerning for thrombus.  Direct-current cardioversion canceled.    Full report to follow.  Findings discussed with patient, his significant other Ms. [name], attending cardiologist.    Tessa Lerner, DO, [facility]  [facility]  [address] [place] [address] [place], [place] [address] Pager: [phone] Office: [phone] 05/21/23 3:08 PM

## 2023-05-21 NOTE — Anesthesia Preprocedure Evaluation (Signed)
Anesthesia Evaluation  Patient identified by MRN, date of birth, ID band Patient awake    Reviewed: Allergy & Precautions, NPO status , Patient's Chart, lab work & pertinent test results  History of Anesthesia Complications (+) PONV and history of anesthetic complications  Airway Mallampati: III  TM Distance: >3 FB Neck ROM: Full    Dental  (+) Teeth Intact, Dental Advisory Given,    Pulmonary neg shortness of breath, neg sleep apnea, neg COPD, neg recent URI   breath sounds clear to auscultation       Cardiovascular hypertension, Pt. on medications + DOE  + dysrhythmias Atrial Fibrillation  Rhythm:Irregular Rate:Tachycardia     Neuro/Psych negative neurological ROS     GI/Hepatic ,GERD  ,,(+) Hepatitis -  Endo/Other    Renal/GU negative Renal ROS     Musculoskeletal  (+) Arthritis ,    Abdominal   Peds  Hematology Lab Results      Component                Value               Date                      WBC                      7.4                 05/21/2023                HGB                      15.2                05/21/2023                HCT                      44.2                05/21/2023                MCV                      82.2                05/21/2023                PLT                      236                 05/21/2023              Anesthesia Other Findings   Reproductive/Obstetrics                             Anesthesia Physical Anesthesia Plan  ASA: 2  Anesthesia Plan: MAC and General   Post-op Pain Management:    Induction: Intravenous  PONV Risk Score and Plan: 3 and Propofol infusion and Treatment may vary due to age or medical condition  Airway Management Planned: Nasal Cannula, Natural Airway and Simple Face Mask  Additional Equipment: None  Intra-op Plan:   Post-operative Plan:   Informed Consent: I have reviewed the patients History and  Physical, chart,  labs and discussed the procedure including the risks, benefits and alternatives for the proposed anesthesia with the patient or authorized representative who has indicated his/her understanding and acceptance.     Dental advisory given  Plan Discussed with: CRNA  Anesthesia Plan Comments:        Anesthesia Quick Evaluation

## 2023-05-21 NOTE — Progress Notes (Signed)
*  PRELIMINARY RESULTS* Echocardiogram Echocardiogram Transesophageal has been performed.  Ernest Boyd 05/21/2023, 2:41 PM

## 2023-05-21 NOTE — Progress Notes (Addendum)
Rounding Note    Patient Name: Ernest Boyd Date of Encounter: 05/21/2023  [facility] Cardiologist: None   Subjective   He feels well this AM. Pending TEE/DCCV  Inpatient Medications    Scheduled Meds:  apixaban  5 mg Oral BID   Continuous Infusions:  sodium chloride 20 mL/hr at 05/21/23 0800   diltiazem (CARDIZEM) infusion 15 mg/hr (05/21/23 0800)   PRN Meds: acetaminophen **OR** acetaminophen, hydrALAZINE, levalbuterol   Vital Signs    Vitals:   05/21/23 0015 05/21/23 0326 05/21/23 0720 05/21/23 0800  BP: (!) 118/92 120/87 122/85   Pulse: (!) 102 96 (!) 103 84  Resp: 18 16 16    Temp: 98.3 F (36.8 C) 98.4 F (36.9 C) 98.4 F (36.9 C)   TempSrc: Oral Oral Oral   SpO2: 95% 93% 92% 93%  Weight:      Height:        Intake/Output Summary (Last 24 hours) at 05/21/2023 0848 Last data filed at 05/21/2023 0800 Gross per 24 hour  Intake 572.56 ml  Output 900 ml  Net -327.44 ml      05/20/2023    6:20 PM 05/20/2023    1:31 PM 05/20/2023   11:35 AM  Last 3 Weights  Weight (lbs) 240 lb 1.6 oz 243 lb 243 lb 12.8 oz  Weight (kg) 108.909 kg 110.224 kg 110.587 kg      Telemetry    Rate controlled afib - Personally Reviewed  ECG    No new - Personally Reviewed  Physical Exam   Vitals:   05/21/23 0720 05/21/23 0800  BP: 122/85   Pulse: (!) 103 84  Resp: 16   Temp: 98.4 F (36.9 C)   SpO2: 92% 93%    GEN: No acute distress.   Neck: No JVD Cardiac: IRRR, no murmurs, rubs, or gallops.  Respiratory: Clear to auscultation bilaterally. GI: Soft, nontender, non-distended  MS: No edema; No deformity. Neuro:  Nonfocal  Psych: Normal affect   Labs    High Sensitivity Troponin:  No results for input(s): "TROPONINIHS" in the last 720 hours.   Chemistry Recent Labs  Lab 05/20/23 1341 05/21/23 0227  NA 135 137  K 3.8 4.1  CL 102 100  CO2 23 21*  GLUCOSE 108* 113*  BUN 12 13  CREATININE 0.98 1.00  CALCIUM 9.6 9.3  PROT 7.9  --   ALBUMIN  4.1  --   AST 19  --   ALT 23  --   ALKPHOS 55  --   BILITOT 2.1*  --   GFRNONAA >60 >60  ANIONGAP 10 16*    Lipids  Recent Labs  Lab 05/21/23 0227  CHOL 172  TRIG 85  HDL 36*  LDLCALC 119*  CHOLHDL 4.8    Hematology Recent Labs  Lab 05/20/23 1341 05/21/23 0227  WBC 9.1 7.4  RBC 5.83* 5.38  HGB 16.4 15.2  HCT 48.4 44.2  MCV 83.0 82.2  MCH 28.1 28.3  MCHC 33.9 34.4  RDW 12.7 12.7  PLT 267 236   Thyroid  Recent Labs  Lab 05/20/23 1843  TSH 1.092    BNP Recent Labs  Lab 05/20/23 1341  BNP 249.2*    DDimer No results for input(s): "DDIMER" in the last 168 hours.   Radiology    DG Chest 2 View  Result Date: 05/20/2023 CLINICAL DATA:  Shortness of breath, fever cough EXAM: CHEST - 2 VIEW COMPARISON:  09/29/2022 FINDINGS: The heart size and mediastinal contours are  within normal limits. Both lungs are clear. The visualized skeletal structures are unremarkable. IMPRESSION: No active cardiopulmonary disease.  Overall stable exam. Electronically Signed   By: Judie Petit.  Shick M.D.   On: 05/20/2023 16:49    Cardiac Studies   Pending TEE  Patient Profile     Ernest Boyd is a 52 y.o. male with a hx of HTN, HLD statin intolerant, who is being seen 05/20/2023 for the evaluation of aflutter at the request of Dr. Pola Corn. Mr. Roanhorse has no prior history of cardiovascular disease.   Assessment & Plan    Paroxysmal Atrial Fibrillation He has been dealing with an ongoing bronchitis.  He presents here with atrial fibrillation/flutter with RVR.  He has no signs of an acute infection or heart failure.  No ACS.  He denies apneic events.  I agree with assessing thyroid function.  His rates are still quite high on the diltiazem drip.   -Will therefore plan to give him Eliquis 10 mg tonight and start 5 mg twice daily tomorrow.  He will require anticoagulation for at least 4 weeks post cardioversion.  With CHA2DS2-VASc equal to 1 he will not need to continue anticoagulation after this  point. -TEE/DCCV today - continue eliquis -will transition to oral diltiazem 120 mg post DCCV, he then can go home if he converts to NSR.      Time Spent Directly with Patient:  I have spent a total of 35 minutes with the patient reviewing hospital notes, telemetry, EKGs, labs and examining the patient as well as establishing an assessment and plan that was discussed personally with the patient.  > 50% of time was spent in direct patient care.   For questions or updates, please contact [facility] Please consult [email or web address] for contact info under        Signed, Maisie Fus, MD  05/21/2023, 8:48 AM

## 2023-05-22 ENCOUNTER — Observation Stay (HOSPITAL_COMMUNITY): Payer: 59

## 2023-05-22 ENCOUNTER — Encounter (HOSPITAL_COMMUNITY): Payer: Self-pay | Admitting: Cardiology

## 2023-05-22 ENCOUNTER — Other Ambulatory Visit (HOSPITAL_COMMUNITY): Payer: Self-pay

## 2023-05-22 DIAGNOSIS — I4891 Unspecified atrial fibrillation: Secondary | ICD-10-CM

## 2023-05-22 DIAGNOSIS — I480 Paroxysmal atrial fibrillation: Secondary | ICD-10-CM | POA: Diagnosis not present

## 2023-05-22 DIAGNOSIS — I5020 Unspecified systolic (congestive) heart failure: Secondary | ICD-10-CM

## 2023-05-22 LAB — ECHOCARDIOGRAM LIMITED
Calc EF: 49.4 %
Est EF: 55
Height: 71 in
S' Lateral: 4.4 cm
Single Plane A2C EF: 51.3 %
Single Plane A4C EF: 47.7 %
Weight: 3841.6 [oz_av]

## 2023-05-22 MED ORDER — PERFLUTREN LIPID MICROSPHERE
1.0000 mL | INTRAVENOUS | Status: AC | PRN
  Administered 2023-05-22: 6 mL via INTRAVENOUS

## 2023-05-22 MED ORDER — DAPAGLIFLOZIN PROPANEDIOL 10 MG PO TABS
10.0000 mg | ORAL_TABLET | Freq: Every day | ORAL | Status: DC
  Administered 2023-05-23 – 2023-05-25 (×3): 10 mg via ORAL
  Filled 2023-05-22 (×3): qty 1

## 2023-05-22 MED ORDER — METOPROLOL TARTRATE 5 MG/5ML IV SOLN
5.0000 mg | Freq: Once | INTRAVENOUS | Status: DC
  Filled 2023-05-22: qty 5

## 2023-05-22 MED ORDER — SPIRONOLACTONE 25 MG PO TABS
25.0000 mg | ORAL_TABLET | Freq: Every day | ORAL | Status: DC
  Administered 2023-05-22 – 2023-05-25 (×4): 25 mg via ORAL
  Filled 2023-05-22 (×4): qty 1

## 2023-05-22 MED ORDER — EMPAGLIFLOZIN 10 MG PO TABS
10.0000 mg | ORAL_TABLET | Freq: Every day | ORAL | Status: DC
  Administered 2023-05-22: 10 mg via ORAL
  Filled 2023-05-22: qty 1

## 2023-05-22 MED ORDER — DILTIAZEM HCL ER COATED BEADS 120 MG PO CP24: 240.0000 mg | ORAL_CAPSULE | Freq: Every day | ORAL | Status: DC

## 2023-05-22 MED ORDER — METOPROLOL TARTRATE 25 MG PO TABS
25.0000 mg | ORAL_TABLET | Freq: Once | ORAL | Status: AC
  Administered 2023-05-22: 25 mg via ORAL
  Filled 2023-05-22: qty 1

## 2023-05-22 MED ORDER — METOPROLOL TARTRATE 5 MG/5ML IV SOLN
5.0000 mg | Freq: Four times a day (QID) | INTRAVENOUS | Status: DC | PRN
  Administered 2023-05-22 – 2023-05-23 (×4): 5 mg via INTRAVENOUS
  Filled 2023-05-22 (×4): qty 5

## 2023-05-22 MED ORDER — LOSARTAN POTASSIUM 25 MG PO TABS
25.0000 mg | ORAL_TABLET | Freq: Every day | ORAL | Status: DC
  Administered 2023-05-22 – 2023-05-25 (×4): 25 mg via ORAL
  Filled 2023-05-22 (×4): qty 1

## 2023-05-22 MED ORDER — DILTIAZEM HCL ER COATED BEADS 180 MG PO CP24: 180.0000 mg | ORAL_CAPSULE | Freq: Every day | ORAL | Status: DC

## 2023-05-22 MED ORDER — METOPROLOL TARTRATE 50 MG PO TABS
50.0000 mg | ORAL_TABLET | Freq: Two times a day (BID) | ORAL | Status: DC
  Administered 2023-05-22 – 2023-05-23 (×3): 50 mg via ORAL
  Filled 2023-05-22 (×3): qty 1

## 2023-05-22 NOTE — Progress Notes (Signed)
   Notified by RN that patient's HR elevated to the 140s. BP 123/94. Not due for his diltiazem until 10 AM.   Ordered metoprolol tartrate 25 mg for now. Dr. Wyline Mood will see during rounds this AM and adjust rate controlling medications  Jonita Albee, PA-C 05/22/2023 7:18 AM

## 2023-05-22 NOTE — Progress Notes (Signed)
   Heart Failure Stewardship Pharmacist Progress Note   PCP: Mort Sawyers, FNP PCP-Cardiologist: None    HPI:  52 yo M with PMH of HTN, HLD, NASH, GERD, and arthritis.   He was seen by his PCP on 11/6 and was noted to be in afib RVR. Was sent to the ED for evaluation. CXR unremarkable. HR 155. Started on diltiazem gtt and cardiology was consulted. Underwent TEE on 11/7 with planned DCCV. Unfortunately, had evidence of LAA thrombus and DCCV was deferred. EF on TEE 40-45%. Diltiazem gtt has been stopped with new low EF. Repeat ECHO pending.   Reports improvement in symptoms. HR down to 100s in the room. Patient and his wife state that he is not always compliant with his medications at home - frequently missing doses of his BP medications and Repatha.   Current HF Medications: Beta Blocker: metoprolol tartrate 50 mg BID ACE/ARB/ARNI: losartan 25 mg daily MRA: spironolactone 25 mg daily SGLT2i: Jardiance 10 mg daily  Prior to admission HF Medications: ACE/ARB/ARNI: lisinopril 10 mg daily  Pertinent Lab Values: Serum creatinine 1.00, BUN 13, Potassium 4.1, Sodium 137, BNP 249.2  Vital Signs: Weight: 240 lbs  Blood pressure: 120/90s  Heart rate: 110-140s  I/O: net -0.2L yesterday; net -1L since admission  Medication Assistance / Insurance Benefits Check: Does the patient have prescription insurance?  Yes Type of insurance plan: Hospital doctor  Outpatient Pharmacy:  Prior to admission [facility] Is the patient willing to use [facility] pharmacy at discharge? Yes Is the patient willing to transition their outpatient pharmacy to utilize a [facility] outpatient pharmacy?   No    Assessment: 1. 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 NYHA class II symptoms. - Not fluid overloaded on exam - Agree with transitioning off diltiazem and starting metoprolol tartrate 50 mg BID - Agree with starting losartan 25 mg daily  and spironolactone 25 mg daily. - Agree with starting SGLT2i. Marcelline Deist is preferred on his insurance. Consider switching from Jardiance 10 mg daily. - Encouraged patient to minimize how frequently he is taking meloxicam as outpatient (claims this is the only thing that works for the arthritis in his knee). - Consider stopping amlodipine on discharge   Plan: 1) Medication changes recommended at this time: - Change Jardiance 10 mg daily to Farxiga 10 mg daily - Stop amlodipine on discharge  2) Patient assistance: - Jardiance requires prior authorization - Farxiga copay $45 - copay card lowers to $0 - Eliquis copay $45 - copay card lowers to $10 - Entresto copay $45 - copay card lowers to $10 - Will provide monthly copay cards for Eliquis, Farxiga, and Entresto as well as a pill box to help with compliance.  - Patient and his wife state that he will be compliant with medications moving forward and understand the risks with not taking medications as prescribed.  3)  Education  - Patient has been educated on current HF medications and potential additions to HF medication regimen - Patient verbalizes understanding that over the next few months, these medication doses may change and more medications may be added to optimize HF regimen - Patient has been educated on basic disease state pathophysiology and goals of therapy - Re-emphasized importance of taking uninterrupted Eliquis   Sharen Hones, PharmD, BCPS Heart Failure Engineer, building services Phone [phone]

## 2023-05-22 NOTE — Plan of Care (Signed)
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
## 2023-05-22 NOTE — Plan of Care (Signed)
  Problem: Health Behavior/Discharge Planning: Goal: Ability to manage health-related needs will improve Outcome: Progressing   Problem: Clinical Measurements: Goal: Ability to maintain clinical measurements within normal limits will improve Outcome: Progressing Goal: Will remain free from infection Outcome: Progressing Goal: Diagnostic test results will improve Outcome: Progressing Goal: Respiratory complications will improve Outcome: Progressing   Problem: Activity: Goal: Risk for activity intolerance will decrease Outcome: Progressing   Problem: Nutrition: Goal: Adequate nutrition will be maintained Outcome: Progressing   Problem: Coping: Goal: Level of anxiety will decrease Outcome: Progressing

## 2023-05-22 NOTE — Progress Notes (Addendum)
Rounding Note    Patient Name: Ernest Boyd Date of Encounter: 05/22/2023  [facility] Cardiologist: None   Subjective   He can feel his heart racing.  Inpatient Medications    Scheduled Meds:  apixaban  5 mg Oral BID   diltiazem  240 mg Oral Daily   metoprolol tartrate  5 mg Intravenous Once   metoprolol tartrate  50 mg Oral BID   Continuous Infusions:   PRN Meds: acetaminophen **OR** acetaminophen, hydrALAZINE, levalbuterol, mouth rinse, perflutren lipid microspheres (DEFINITY) IV suspension   Vital Signs    Vitals:   05/21/23 1941 05/21/23 2338 05/22/23 0414 05/22/23 0758  BP:  (!) 118/100 (!) 123/94 (!) 122/108  Pulse:  95 (!) 128 (!) 134  Resp:  19 16 15   Temp: 97.8 F (36.6 C) 98.5 F (36.9 C) 98.4 F (36.9 C) 98.1 F (36.7 C)  TempSrc: Oral Oral Oral Oral  SpO2:  93% (!) 89% 96%  Weight:      Height:        Intake/Output Summary (Last 24 hours) at 05/22/2023 0918 Last data filed at 05/22/2023 0759 Gross per 24 hour  Intake 354.1 ml  Output 1050 ml  Net -695.9 ml      05/20/2023    6:20 PM 05/20/2023    1:31 PM 05/20/2023   11:35 AM  Last 3 Weights  Weight (lbs) 240 lb 1.6 oz 243 lb 243 lb 12.8 oz  Weight (kg) 108.909 kg 110.224 kg 110.587 kg      Telemetry    Rate controlled afib - Personally Reviewed  ECG    No new - Personally Reviewed  Physical Exam   Vitals:   05/22/23 0414 05/22/23 0758  BP: (!) 123/94 (!) 122/108  Pulse: (!) 128 (!) 134  Resp: 16 15  Temp: 98.4 F (36.9 C) 98.1 F (36.7 C)  SpO2: (!) 89% 96%    GEN: No acute distress.   Neck: No JVD Cardiac: IRRR, no murmurs, rubs, or gallops.  Respiratory: Clear to auscultation bilaterally. GI: Soft, nontender, non-distended  MS: No edema; No deformity. Neuro:  Nonfocal  Psych: Normal affect   Labs    High Sensitivity Troponin:  No results for input(s): "TROPONINIHS" in the last 720 hours.   Chemistry Recent Labs  Lab 05/20/23 1341 05/21/23 0227   NA 135 137  K 3.8 4.1  CL 102 100  CO2 23 21*  GLUCOSE 108* 113*  BUN 12 13  CREATININE 0.98 1.00  CALCIUM 9.6 9.3  PROT 7.9  --   ALBUMIN 4.1  --   AST 19  --   ALT 23  --   ALKPHOS 55  --   BILITOT 2.1*  --   GFRNONAA >60 >60  ANIONGAP 10 16*    Lipids  Recent Labs  Lab 05/21/23 0227  CHOL 172  TRIG 85  HDL 36*  LDLCALC 119*  CHOLHDL 4.8    Hematology Recent Labs  Lab 05/20/23 1341 05/21/23 0227  WBC 9.1 7.4  RBC 5.83* 5.38  HGB 16.4 15.2  HCT 48.4 44.2  MCV 83.0 82.2  MCH 28.1 28.3  MCHC 33.9 34.4  RDW 12.7 12.7  PLT 267 236   Thyroid  Recent Labs  Lab 05/20/23 1843  TSH 1.092    BNP Recent Labs  Lab 05/20/23 1341  BNP 249.2*    DDimer No results for input(s): "DDIMER" in the last 168 hours.   Radiology    ECHO TEE  Result Date: 05/21/2023    TRANSESOPHOGEAL ECHO REPORT   Patient Name:   Ernest Boyd Date of Exam: 05/21/2023 Medical Rec #:  [id number]   Height:       71.0 in Accession #:    [id number]  Weight:       240.1 lb Date of Birth:  Jul 02, 1971  BSA:          2.279 m Patient Age:    52 years    BP:           120/96 mmHg Patient Gender: M           HR:           140 bpm. Exam Location:  Inpatient Procedure: Transesophageal Echo, Cardiac Doppler and Color Doppler Indications:    Afib with RVR  History:        Patient has no prior history of Echocardiogram examinations.                 Arrythmias:Atrial Fibrillation.  Sonographer:    Dondra Prader RVT RCS Referring Phys: [id number] SHENG L HALEY PROCEDURE: After discussion of the risks and benefits of a TEE, an informed consent was obtained from the patient. The transesophogeal probe was passed without difficulty through the esophogus of the patient. Sedation performed by different physician. Image quality was good. The patient's vital signs; including heart rate, blood pressure, and oxygen saturation; remained stable throughout the procedure. Supplementary images were obtained from transthoracic windows  as indicated to answer the clinical question. The patient developed no complications during the procedure. Cardioversion was canceled.  IMPRESSIONS  1. Left ventricular ejection fraction, by estimation, is 40 to 45%. The left ventricle has mildly decreased function. The left ventricle has no regional wall motion abnormalities. There is mild left ventricular hypertrophy. Left ventricular diastolic function could not be evaluated.  2. Right ventricular systolic function is normal. The right ventricular size is normal.  3. Echo lucency noted within the left atrial appendage suspicious of thrombus.. Left atrial size was dilated. The LAA emptying velocity was 28 cm/s.  4. The mitral valve is normal in structure. Mild mitral valve regurgitation. No evidence of mitral stenosis.  5. The aortic valve is normal in structure. Aortic valve regurgitation is not visualized. No aortic stenosis is present.  6. Rhythm strip during this exam demonstrates atrial fibrillation. Conclusion(s)/Recommendation(s): Findings concerning for LA/LAA thrombus. Cardioversion was not performed. Would recommend 4 weeks of anticoagulation prior to further attempts at cardioversion. FINDINGS  Left Ventricle: Left ventricular ejection fraction, by estimation, is 40 to 45%. The left ventricle has mildly decreased function. The left ventricle has no regional wall motion abnormalities. The left ventricular internal cavity size was normal in size. There is mild left ventricular hypertrophy. Left ventricular diastolic function could not be evaluated due to atrial fibrillation. Left ventricular diastolic function could not be evaluated. Right Ventricle: The right ventricular size is normal. No increase in right ventricular wall thickness. Right ventricular systolic function is normal. Left Atrium: Echo lucency noted within the left atrial appendage suspicious of thrombus. Left atrial size was dilated. Spontaneous echo contrast was present in the left atrium  and left atrial appendage. The LAA emptying velocity was 28 cm/s. Right Atrium: Right atrial size was normal in size. Pericardium: There is no evidence of pericardial effusion. Mitral Valve: The mitral valve is normal in structure. Mild mitral valve regurgitation. No evidence of mitral valve stenosis. Tricuspid Valve: The tricuspid valve is normal in structure. Tricuspid valve  regurgitation is mild . No evidence of tricuspid stenosis. Aortic Valve: The aortic valve is normal in structure. Aortic valve regurgitation is not visualized. No aortic stenosis is present. Pulmonic Valve: The pulmonic valve was normal in structure. Pulmonic valve regurgitation is not visualized. No evidence of pulmonic stenosis. Aorta: The aortic root and ascending aorta are structurally normal, with no evidence of dilitation. There is minimal (Grade I) layered plaque involving the descending aorta. Venous: The left upper pulmonary vein is normal. IAS/Shunts: The atrial septum is grossly normal. EKG: Rhythm strip during this exam demonstrates atrial fibrillation. Additional Comments: Spectral Doppler performed. LEFT VENTRICLE PLAX 2D LVIDd:         4.30 cm LVIDs:         3.40 cm LV PW:         1.20 cm LV IVS:        1.10 cm LVOT diam:     1.80 cm LVOT Area:     2.54 cm   AORTA Ao Root diam: 3.20 cm Ao Asc diam:  3.20 cm  SHUNTS Systemic Diam: 1.80 cm Sunit Tolia Electronically signed by Tessa Lerner Signature Date/Time: 05/21/2023/3:29:53 PM    Final    ABORTED INVASIVE LAB PROCEDURE  Result Date: 05/21/2023 See surgical note for result.  EP STUDY  Result Date: 05/21/2023 See surgical note for result.  DG Chest 2 View  Result Date: 05/20/2023 CLINICAL DATA:  Shortness of breath, fever cough EXAM: CHEST - 2 VIEW COMPARISON:  09/29/2022 FINDINGS: The heart size and mediastinal contours are within normal limits. Both lungs are clear. The visualized skeletal structures are unremarkable. IMPRESSION: No active cardiopulmonary disease.   Overall stable exam. Electronically Signed   By: Judie Petit.  Shick M.D.   On: 05/20/2023 16:49    Cardiac Studies     Patient Profile     KEATIN SHOREY is a 52 y.o. male with a hx of HTN, HLD statin intolerant, who is being seen 05/20/2023 for the evaluation of aflutter at the request of Dr. Pola Corn. Mr. Langston has no prior history of cardiovascular disease.   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  He presents here with atrial fibrillation/flutter with RVR.  He has no signs of an acute infection or heart failure.  No ACS.  He denies apneic events.  He was initiated on a diltiazem drip.  Plan was for TEE and cardioversion. -On his TEE 11/7 he had significant spontaneous contrast, EF was felt to be mildly reduced.  Therefore his  cardioversion was canceled.  Will plan to repeat a TEE and cardioversion in 3 weeks -He has had some RVR overnight -Looking at his surface echo his EF is significantly reduced.  Will stop his diltiazem considering it is a negative inotrope, will start spironolactone 25 mg daily, losartan 25 mg daily, farxiga , as well as adding metoprolol to tartrate 50 mg twice daily - continue eliquis 5 mg twice daily  Would recommend that he stay until tomorrow to assess how he does on his new medications.  He is otherwise euvolemic.  Time Spent Directly with Patient:  I have spent a total of 35 minutes with the patient reviewing hospital notes, telemetry, EKGs, labs and examining the patient as well as establishing an assessment and plan that was discussed personally with the patient.  > 50% of time was spent in direct patient care.   For  questions or updates, please contact [facility] Please consult [email or web address] for contact info under        Signed, Maisie Fus, MD  05/22/2023, 9:18 AM

## 2023-05-22 NOTE — Progress Notes (Signed)
  Echocardiogram 2D Echocardiogram has been performed.  Ernest Boyd Rakel Junio 05/22/2023, 9:08 AM

## 2023-05-22 NOTE — TOC Benefit Eligibility Note (Addendum)
Patient Product/process development scientist completed.    The patient is insured through [employer]. Patient has [employer], may use a copay card, and/or apply for patient assistance if available.    Ran test claim for Eliquis 5 mg and the current 30 day co-pay is $45.00.  Ran test claim for Entresto 24-26 mg and the current 30 day co-pay is $45.00  Ran test claim for Farxiga 10 mg and the current 30 day co-pay is $45.00  Ran test claim for Jardiance 10 mg and Requires Prior Authorization  This test claim was processed through [employer]- copay amounts may vary at other pharmacies due to [employer], or as the patient moves through the different stages of their insurance plan.     Roland Earl, CPHT Pharmacy Technician III Certified Patient Advocate [facility] Patient Advocate Team Direct Number: [phone]  Fax: [phone]

## 2023-05-23 DIAGNOSIS — I4891 Unspecified atrial fibrillation: Secondary | ICD-10-CM

## 2023-05-23 DIAGNOSIS — I10 Essential (primary) hypertension: Secondary | ICD-10-CM | POA: Diagnosis not present

## 2023-05-23 DIAGNOSIS — E66811 Obesity, class 1: Secondary | ICD-10-CM | POA: Diagnosis not present

## 2023-05-23 DIAGNOSIS — E6609 Other obesity due to excess calories: Secondary | ICD-10-CM

## 2023-05-23 DIAGNOSIS — E7800 Pure hypercholesterolemia, unspecified: Secondary | ICD-10-CM

## 2023-05-23 DIAGNOSIS — I483 Typical atrial flutter: Secondary | ICD-10-CM | POA: Diagnosis not present

## 2023-05-23 LAB — HEMOGLOBIN A1C
Hgb A1c MFr Bld: 5.4 % (ref 4.8–5.6)
Mean Plasma Glucose: 108.28 mg/dL

## 2023-05-23 MED ORDER — AMIODARONE LOAD VIA INFUSION
150.0000 mg | Freq: Once | INTRAVENOUS | Status: AC
  Administered 2023-05-23: 150 mg via INTRAVENOUS
  Filled 2023-05-23: qty 83.34

## 2023-05-23 MED ORDER — METOPROLOL TARTRATE 50 MG PO TABS
75.0000 mg | ORAL_TABLET | Freq: Two times a day (BID) | ORAL | Status: DC
  Administered 2023-05-23 – 2023-05-24 (×2): 75 mg via ORAL
  Filled 2023-05-23 (×2): qty 1

## 2023-05-23 MED ORDER — AMIODARONE HCL IN DEXTROSE 360-4.14 MG/200ML-% IV SOLN
60.0000 mg/h | INTRAVENOUS | Status: AC
  Administered 2023-05-23: 60 mg/h via INTRAVENOUS
  Filled 2023-05-23 (×2): qty 200

## 2023-05-23 MED ORDER — AMIODARONE HCL IN DEXTROSE 360-4.14 MG/200ML-% IV SOLN
30.0000 mg/h | INTRAVENOUS | Status: DC
  Administered 2023-05-23 – 2023-05-24 (×3): 30 mg/h via INTRAVENOUS
  Filled 2023-05-23 (×4): qty 200

## 2023-05-23 NOTE — Progress Notes (Signed)
Rounding Note    Patient Name: Ernest Boyd Date of Encounter: 05/23/2023  [facility] Cardiologist: None   Subjective   Patient denies anginal chest pain. Appreciates tachycardia. Concerned about continued elevated heart rate  Inpatient Medications    Scheduled Meds:  amiodarone  150 mg Intravenous Once   apixaban  5 mg Oral BID   dapagliflozin propanediol  10 mg Oral Daily   losartan  25 mg Oral Daily   metoprolol tartrate  50 mg Oral BID   spironolactone  25 mg Oral Daily   Continuous Infusions:  amiodarone     Followed by   amiodarone      PRN Meds: acetaminophen **OR** acetaminophen, hydrALAZINE, levalbuterol, metoprolol tartrate, mouth rinse   Vital Signs    Vitals:   05/22/23 2332 05/23/23 0351 05/23/23 0752 05/23/23 0855  BP: 114/79 (!) 120/96 (!) 118/91 (!) 118/91  Pulse: 73 (!) 123 (!) 137 (!) 142  Resp: 19 17 17    Temp: 98.2 F (36.8 C) 97.7 F (36.5 C) 98.3 F (36.8 C)   TempSrc: Oral Oral Oral   SpO2: 95% 96% 96%   Weight:      Height:        Intake/Output Summary (Last 24 hours) at 05/23/2023 0926 Last data filed at 05/23/2023 0753 Gross per 24 hour  Intake 240 ml  Output 1650 ml  Net -1410 ml      05/20/2023    6:20 PM 05/20/2023    1:31 PM 05/20/2023   11:35 AM  Last 3 Weights  Weight (lbs) 240 lb 1.6 oz 243 lb 243 lb 12.8 oz  Weight (kg) 108.909 kg 110.224 kg 110.587 kg      Telemetry    Afib w/ RVR 140-150 - Personally Reviewed  ECG    No new - Personally Reviewed  Physical Exam   Vitals:   05/23/23 0752 05/23/23 0855  BP: (!) 118/91 (!) 118/91  Pulse: (!) 137 (!) 142  Resp: 17   Temp: 98.3 F (36.8 C)   SpO2: 96%     GEN: No acute distress.   Neck: No JVD Cardiac: IRRR, tachycardia, no murmurs rubs or gallops appreciated secondary to tachycardia.   Respiratory: Clear to auscultation bilaterally. GI: Obese, soft, nontender, non-distended  MS: No edema; No deformity, 2+ bilateral DP and PT pulses  follow-up Neuro:  Nonfocal  Psych: Normal affect   Labs    High Sensitivity Troponin:  No results for input(s): "TROPONINIHS" in the last 720 hours.   Chemistry Recent Labs  Lab 05/20/23 1341 05/21/23 0227  NA 135 137  K 3.8 4.1  CL 102 100  CO2 23 21*  GLUCOSE 108* 113*  BUN 12 13  CREATININE 0.98 1.00  CALCIUM 9.6 9.3  PROT 7.9  --   ALBUMIN 4.1  --   AST 19  --   ALT 23  --   ALKPHOS 55  --   BILITOT 2.1*  --   GFRNONAA >60 >60  ANIONGAP 10 16*    Lipids  Recent Labs  Lab 05/21/23 0227  CHOL 172  TRIG 85  HDL 36*  LDLCALC 119*  CHOLHDL 4.8    Hematology Recent Labs  Lab 05/20/23 1341 05/21/23 0227  WBC 9.1 7.4  RBC 5.83* 5.38  HGB 16.4 15.2  HCT 48.4 44.2  MCV 83.0 82.2  MCH 28.1 28.3  MCHC 33.9 34.4  RDW 12.7 12.7  PLT 267 236   Thyroid  Recent Labs  Lab  05/20/23 1843  TSH 1.092    BNP Recent Labs  Lab 05/20/23 1341  BNP 249.2*    DDimer No results for input(s): "DDIMER" in the last 168 hours.   Radiology    ECHOCARDIOGRAM LIMITED  Result Date: 05/22/2023    ECHOCARDIOGRAM LIMITED REPORT   Patient Name:   BECKETT STALLMAN Date of Exam: 05/22/2023 Medical Rec #:  [id number]   Height:       71.0 in Accession #:    [id number]  Weight:       240.1 lb Date of Birth:  12-11-1970  BSA:          2.279 m Patient Age:    52 years    BP:           123/94 mmHg Patient Gender: M           HR:           126 bpm. Exam Location:  Inpatient Procedure: Limited Echo Indications:    Atrial Fibrillation  History:        Patient has prior history of Echocardiogram examinations, most                 recent 05/21/2023. Risk Factors:Hypertension and Dyslipidemia.  Sonographer:    Karma Ganja Referring Phys: [id number] [facility] E BRANCH  Sonographer Comments: Technically difficult study due to poor echo windows and patient is obese. IMPRESSIONS  1. Poor acoustic windows limit study. Definity used Cannot fully evaluate regional wall motion as endocardium is not well seen in all  walls.  2. Left ventricular ejection fraction, by estimation, is 55%. The left ventricle has low normal function. There is mild left ventricular hypertrophy.  3. Right ventricular systolic function is normal. The right ventricular size is normal.  4. The mitral valve is normal in structure. Trivial mitral valve regurgitation.  5. The aortic valve is tricuspid. Aortic valve regurgitation is not visualized. Aortic valve sclerosis is present, with no evidence of aortic valve stenosis.  6. The inferior vena cava is normal in size with greater than 50% respiratory variability, suggesting right atrial pressure of 3 mmHg. FINDINGS  Left Ventricle: Left ventricular ejection fraction, by estimation, is 55%. The left ventricle has low normal function. Definity contrast agent was given IV to delineate the left ventricular endocardial borders. The left ventricular internal cavity size was normal in size. There is mild left ventricular hypertrophy. Right Ventricle: The right ventricular size is normal. Right ventricular systolic function is normal. Left Atrium: Left atrial size was normal in size. Right Atrium: Right atrial size was normal in size. Pericardium: There is no evidence of pericardial effusion. Mitral Valve: The mitral valve is normal in structure. There is mild thickening of the mitral valve leaflet(s). Mild mitral annular calcification. Trivial mitral valve regurgitation. Aortic Valve: The aortic valve is tricuspid. Aortic valve regurgitation is not visualized. Aortic valve sclerosis is present, with no evidence of aortic valve stenosis. Pulmonic Valve: The pulmonic valve was not well visualized. Pulmonic valve regurgitation is not visualized. Venous: The inferior vena cava is normal in size with greater than 50% respiratory variability, suggesting right atrial pressure of 3 mmHg. Additional Comments: Spectral Doppler performed. Color Doppler performed.  LEFT VENTRICLE PLAX 2D LVIDd:         5.00 cm LVIDs:          4.40 cm LV PW:         1.20 cm LV IVS:        1.20  cm LVOT diam:     2.10 cm LVOT Area:     3.46 cm  LV Volumes (MOD) LV vol d, MOD A2C: 46.2 ml LV vol d, MOD A4C: 80.1 ml LV vol s, MOD A2C: 22.5 ml LV vol s, MOD A4C: 41.9 ml LV SV MOD A2C:     23.7 ml LV SV MOD A4C:     80.1 ml LV SV MOD BP:      30.6 ml IVC IVC diam: 2.00 cm LEFT ATRIUM         Index LA diam:    4.60 cm 2.02 cm/m   AORTA Ao Root diam: 3.40 cm  SHUNTS Systemic Diam: 2.10 cm Dietrich Pates MD Electronically signed by Dietrich Pates MD Signature Date/Time: 05/22/2023/12:12:32 PM    Final    ECHO TEE  Result Date: 05/21/2023    TRANSESOPHOGEAL ECHO REPORT   Patient Name:   DOAK SHROCK Date of Exam: 05/21/2023 Medical Rec #:  [id number]   Height:       71.0 in Accession #:    [id number]  Weight:       240.1 lb Date of Birth:  [birth year]  BSA:          2.279 m Patient Age:    52 years    BP:           120/96 mmHg Patient Gender: M           HR:           140 bpm. Exam Location:  Inpatient Procedure: Transesophageal Echo, Cardiac Doppler and Color Doppler Indications:    Afib with RVR  History:        Patient has no prior history of Echocardiogram examinations.                 Arrythmias:Atrial Fibrillation.  Sonographer:    Dondra Prader RVT RCS Referring Phys: [id number] SHENG L HALEY PROCEDURE: After discussion of the risks and benefits of a TEE, an informed consent was obtained from the patient. The transesophogeal probe was passed without difficulty through the esophogus of the patient. Sedation performed by different physician. Image quality was good. The patient's vital signs; including heart rate, blood pressure, and oxygen saturation; remained stable throughout the procedure. Supplementary images were obtained from transthoracic windows as indicated to answer the clinical question. The patient developed no complications during the procedure. Cardioversion was canceled.  IMPRESSIONS  1. Left ventricular ejection fraction, by estimation, is 40 to 45%. The  left ventricle has mildly decreased function. The left ventricle has no regional wall motion abnormalities. There is mild left ventricular hypertrophy. Left ventricular diastolic function could not be evaluated.  2. Right ventricular systolic function is normal. The right ventricular size is normal.  3. Echo lucency noted within the left atrial appendage suspicious of thrombus.. Left atrial size was dilated. The LAA emptying velocity was 28 cm/s.  4. The mitral valve is normal in structure. Mild mitral valve regurgitation. No evidence of mitral stenosis.  5. The aortic valve is normal in structure. Aortic valve regurgitation is not visualized. No aortic stenosis is present.  6. Rhythm strip during this exam demonstrates atrial fibrillation. Conclusion(s)/Recommendation(s): Findings concerning for LA/LAA thrombus. Cardioversion was not performed. Would recommend 4 weeks of anticoagulation prior to further attempts at cardioversion. FINDINGS  Left Ventricle: Left ventricular ejection fraction, by estimation, is 40 to 45%. The left ventricle has mildly decreased function. The left ventricle has no regional wall motion abnormalities. The  left ventricular internal cavity size was normal in size. There is mild left ventricular hypertrophy. Left ventricular diastolic function could not be evaluated due to atrial fibrillation. Left ventricular diastolic function could not be evaluated. Right Ventricle: The right ventricular size is normal. No increase in right ventricular wall thickness. Right ventricular systolic function is normal. Left Atrium: Echo lucency noted within the left atrial appendage suspicious of thrombus. Left atrial size was dilated. Spontaneous echo contrast was present in the left atrium and left atrial appendage. The LAA emptying velocity was 28 cm/s. Right Atrium: Right atrial size was normal in size. Pericardium: There is no evidence of pericardial effusion. Mitral Valve: The mitral valve is normal in  structure. Mild mitral valve regurgitation. No evidence of mitral valve stenosis. Tricuspid Valve: The tricuspid valve is normal in structure. Tricuspid valve regurgitation is mild . No evidence of tricuspid stenosis. Aortic Valve: The aortic valve is normal in structure. Aortic valve regurgitation is not visualized. No aortic stenosis is present. Pulmonic Valve: The pulmonic valve was normal in structure. Pulmonic valve regurgitation is not visualized. No evidence of pulmonic stenosis. Aorta: The aortic root and ascending aorta are structurally normal, with no evidence of dilitation. There is minimal (Grade I) layered plaque involving the descending aorta. Venous: The left upper pulmonary vein is normal. IAS/Shunts: The atrial septum is grossly normal. EKG: Rhythm strip during this exam demonstrates atrial fibrillation. Additional Comments: Spectral Doppler performed. LEFT VENTRICLE PLAX 2D LVIDd:         4.30 cm LVIDs:         3.40 cm LV PW:         1.20 cm LV IVS:        1.10 cm LVOT diam:     1.80 cm LVOT Area:     2.54 cm   AORTA Ao Root diam: 3.20 cm Ao Asc diam:  3.20 cm  SHUNTS Systemic Diam: 1.80 cm Vershawn Westrup Electronically signed by Tessa Lerner Signature Date/Time: 05/21/2023/3:29:53 PM    Final    ABORTED INVASIVE LAB PROCEDURE  Result Date: 05/21/2023 See surgical note for result.  EP STUDY  Result Date: 05/21/2023 See surgical note for result.   Cardiac Studies / Risk Factors    Click Here to Calculate/Change CHADS2VASc Score The patient's CHADS2-VASc score is 1, indicating a 0.6% annual risk of stroke.   CHF History: No HTN History: Yes Diabetes History: No Stroke History: No Vascular Disease History: No   Patient Profile     SADDIQ EHRHART is a 52 y.o. male with a hx of HTN, HLD statin intolerant, who is being seen 05/20/2023 for the evaluation of aflutter at the request of Dr. Pola Corn. Mr. Westenhaver has no prior history of cardiovascular disease.   Assessment & Plan    Atrial  fibrillation with rapid ventricular rate Duration unknown. Likely brought on by acute bronchitis infection in the recent past. Initial plan was to undergo TEE/cardioversion.  However TEE concerning for smoke within the left atrium/appendage and suspicion for left atrial appendage thrombus and therefore cardioversion aborted. Was on IV Cardizem -but discontinued due to mild reduction in LVEF Started on beta-blocker therapy-no significant improvement ventricular rate. Patient's ventricular rate remains in the 140-150 bpm. Will start amiodarone drip -would avoid long-term amiodarone use given his young age.  Patient is aware that if he does continue amiodarone long-term side effect profile needs to be monitored. Currently TSH, AST, ALT within normal limits Continue Lopressor 50 mg p.o. twice daily. Continue Eliquis  5 mg p.o. twice daily Recommend anticoagulation for at least 3 weeks if possible for prior to reconsideration of TEE guided cardioversion to restore normal sinus rhythm. Long-term need of oral anticoagulation will need to be discussed as outpatient with cardiology-given his clinical trajectory. Patient states that he would like to follow-up with Dr. Tresa Res as outpatient (his father's cardiologist) Patient denies any use of alcohol. Should be screened for sleep apnea as outpatient. Will check A1c to see his [place] score changes.   Acute mildly reduced heart failure Likely secondary to tachycardia mediated in the setting of A-fib with RVR. Started on initial GDMT on 05/22/2023 per Dr. Jason Nest on [place] 10 mg p.o. daily. Started on losartan 25 mg p.o. daily. Started on spironolactone 25 mg p.o. daily  Acute bronchitis: Currently managed by primary team.  Hyperlipidemia: On Repatha as outpatient.  Time Spent Directly with Patient:  I have spent a total of 35 minutes with the patient reviewing hospital notes, telemetry, EKGs, labs and examining the patient as well as  establishing an assessment and plan that was discussed personally with the patient.  > 50% of time was spent in direct patient care.   For questions or updates, please contact [facility] Please consult [email or web address] for contact info under      Signed, Tessa Lerner, DO, [facility]  [facility]  [address] [place], [place] [address] Pager: [phone] Office: [phone] 05/23/23 9:26 AM

## 2023-05-23 NOTE — Progress Notes (Signed)
Patients resting Heart has stayed mainly around 130 - to high 150s for >24hrs

## 2023-05-23 NOTE — Plan of Care (Signed)
  Problem: Education: Goal: Knowledge of General Education information will improve Description: Including pain rating scale, medication(s)/side effects and non-pharmacologic comfort measures Outcome: Progressing   Problem: Clinical Measurements: Goal: Will remain free from infection Outcome: Progressing Goal: Respiratory complications will improve Outcome: Progressing   Problem: Activity: Goal: Risk for activity intolerance will decrease Outcome: Progressing   Problem: Nutrition: Goal: Adequate nutrition will be maintained Outcome: Progressing   Problem: Coping: Goal: Level of anxiety will decrease Outcome: Progressing   
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
## 2023-05-23 NOTE — Progress Notes (Signed)
Mobility Specialist Progress Note    05/23/23 1131  Mobility  Activity Ambulated independently in hallway  Level of Assistance Standby assist, set-up cues, supervision of patient - no hands on  Assistive Device None  Distance Ambulated (ft) 250 ft  Activity Response Tolerated well  $Mobility charge 1 Mobility  Mobility Specialist Start Time (ACUTE ONLY) 1124  Mobility Specialist Stop Time (ACUTE ONLY) 1131  Mobility Specialist Time Calculation (min) (ACUTE ONLY) 7 min   Pre-Mobility: 104 HR During Mobility: 122 HR Post-Mobility: 102 HR  Pt received in bed and agreeable. No complaints on walk. Returned to sitting EOB with call bell in reach.   [place] Mobility Specialist  Please Neurosurgeon or Rehab Office at [phone]

## 2023-05-24 ENCOUNTER — Encounter (HOSPITAL_COMMUNITY): Payer: Self-pay | Admitting: Cardiology

## 2023-05-24 DIAGNOSIS — E7800 Pure hypercholesterolemia, unspecified: Secondary | ICD-10-CM

## 2023-05-24 DIAGNOSIS — I4891 Unspecified atrial fibrillation: Secondary | ICD-10-CM | POA: Diagnosis not present

## 2023-05-24 DIAGNOSIS — E66811 Obesity, class 1: Secondary | ICD-10-CM

## 2023-05-24 DIAGNOSIS — I10 Essential (primary) hypertension: Secondary | ICD-10-CM | POA: Diagnosis not present

## 2023-05-24 DIAGNOSIS — Z6833 Body mass index (BMI) 33.0-33.9, adult: Secondary | ICD-10-CM

## 2023-05-24 DIAGNOSIS — E6609 Other obesity due to excess calories: Secondary | ICD-10-CM

## 2023-05-24 MED ORDER — METOPROLOL TARTRATE 50 MG PO TABS
100.0000 mg | ORAL_TABLET | Freq: Two times a day (BID) | ORAL | Status: DC
  Administered 2023-05-24 – 2023-05-25 (×2): 100 mg via ORAL
  Filled 2023-05-24 (×2): qty 2

## 2023-05-24 MED ORDER — METOPROLOL TARTRATE 25 MG PO TABS
25.0000 mg | ORAL_TABLET | Freq: Once | ORAL | Status: AC
  Administered 2023-05-24: 25 mg via ORAL
  Filled 2023-05-24: qty 1

## 2023-05-24 NOTE — Plan of Care (Signed)

## 2023-05-24 NOTE — Plan of Care (Signed)
  Problem: Education: Goal: Knowledge of General Education information will improve Description: Including pain rating scale, medication(s)/side effects and non-pharmacologic comfort measures Outcome: Progressing   Problem: Clinical Measurements: Goal: Will remain free from infection Outcome: Progressing   Problem: Activity: Goal: Risk for activity intolerance will decrease Outcome: Progressing   Problem: Nutrition: Goal: Adequate nutrition will be maintained Outcome: Progressing   Problem: Coping: Goal: Level of anxiety will decrease Outcome: Progressing   Problem: Elimination: Goal: Will not experience complications related to urinary retention Outcome: Progressing   Problem: Pain Management: Goal: General experience of comfort will improve Outcome: Progressing   Problem: Safety: Goal: Ability to remain free from injury will improve Outcome: Progressing   Problem: Skin Integrity: Goal: Risk for impaired skin integrity will decrease Outcome: Progressing
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
## 2023-05-24 NOTE — Anesthesia Postprocedure Evaluation (Signed)
Anesthesia Post Note  Patient: Ernest Boyd  Procedure(s) Performed: TRANSESOPHAGEAL ECHOCARDIOGRAM INVASIVE LAB ABORTED CASE     Patient location during evaluation: PACU Anesthesia Type: MAC Level of consciousness: awake and alert Pain management: pain level controlled Vital Signs Assessment: post-procedure vital signs reviewed and stable Respiratory status: spontaneous breathing, nonlabored ventilation, respiratory function stable and patient connected to nasal cannula oxygen Cardiovascular status: blood pressure returned to baseline and stable Postop Assessment: no apparent nausea or vomiting Anesthetic complications: no   No notable events documented.               Beckett Hickmon
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
## 2023-05-25 ENCOUNTER — Other Ambulatory Visit (HOSPITAL_COMMUNITY): Payer: Self-pay

## 2023-05-25 ENCOUNTER — Ambulatory Visit: Payer: 59 | Admitting: Family

## 2023-05-25 ENCOUNTER — Telehealth: Payer: Self-pay | Admitting: Physician Assistant

## 2023-05-25 DIAGNOSIS — E66811 Obesity, class 1: Secondary | ICD-10-CM | POA: Diagnosis not present

## 2023-05-25 DIAGNOSIS — I4891 Unspecified atrial fibrillation: Secondary | ICD-10-CM

## 2023-05-25 DIAGNOSIS — I10 Essential (primary) hypertension: Secondary | ICD-10-CM | POA: Diagnosis not present

## 2023-05-25 DIAGNOSIS — E7800 Pure hypercholesterolemia, unspecified: Secondary | ICD-10-CM | POA: Diagnosis not present

## 2023-05-25 DIAGNOSIS — J209 Acute bronchitis, unspecified: Secondary | ICD-10-CM

## 2023-05-25 DIAGNOSIS — I483 Typical atrial flutter: Secondary | ICD-10-CM | POA: Diagnosis not present

## 2023-05-25 DIAGNOSIS — I4819 Other persistent atrial fibrillation: Secondary | ICD-10-CM

## 2023-05-25 MED ORDER — LOSARTAN POTASSIUM 25 MG PO TABS: 25.0000 mg | ORAL_TABLET | Freq: Every day | ORAL | 1 refills | Status: DC

## 2023-05-25 MED ORDER — DAPAGLIFLOZIN PROPANEDIOL 10 MG PO TABS: 10.0000 mg | ORAL_TABLET | Freq: Every day | ORAL | 1 refills | Status: DC

## 2023-05-25 MED ORDER — METOPROLOL TARTRATE 100 MG PO TABS: 100.0000 mg | ORAL_TABLET | Freq: Two times a day (BID) | ORAL | 1 refills | Status: DC

## 2023-05-25 MED ORDER — AMIODARONE HCL 200 MG PO TABS: 200.0000 mg | ORAL_TABLET | Freq: Every day | ORAL | 1 refills | Status: DC

## 2023-05-25 MED ORDER — SPIRONOLACTONE 25 MG PO TABS: 25.0000 mg | ORAL_TABLET | Freq: Every day | ORAL | 1 refills | Status: DC

## 2023-05-25 MED ORDER — APIXABAN 5 MG PO TABS: 5.0000 mg | ORAL_TABLET | Freq: Two times a day (BID) | ORAL | 1 refills | Status: DC

## 2023-05-25 NOTE — TOC Transition Note (Signed)
Transition of Care [facility]) - CM/SW Discharge Note   Patient Details  Name: Ernest Boyd MRN: [id number] Date of Birth: 23-Apr-1971  Transition of Care [facility]) CM/SW Contact:  Harriet Masson, RN Phone Number: 05/25/2023, 12:18 PM   Clinical Narrative:    Patient stable for discharge. Pharmacist has given patient eliquis coupon.  Patient has Cardiologist follow up apt on AVS. No other TOC needs at this time.   Final next level of care: Home/Self Care Barriers to Discharge: Barriers Resolved   Patient Goals and CMS Choice    Return home  Discharge Placement  home                         Discharge Plan and Services Additional resources added to the After Visit Summary for                                       Social Determinants of Health (SDOH) Interventions SDOH Screenings   Food Insecurity: No Food Insecurity (05/20/2023)  Housing: Low Risk  (05/22/2023)  Transportation Needs: No Transportation Needs (05/22/2023)  Utilities: Not At Risk (05/20/2023)  Alcohol Screen: Low Risk  (05/22/2023)  Depression (PHQ2-9): Low Risk  (10/24/2022)  Financial Resource Strain: Low Risk  (05/22/2023)  Tobacco Use: Low Risk  (05/20/2023)     Readmission Risk Interventions    05/25/2023   12:18 PM  Readmission Risk Prevention Plan  Post Dischage Appt Complete  Medication Screening Complete  Transportation Screening Complete

## 2023-05-25 NOTE — Progress Notes (Signed)
Mobility Specialist Progress Note:   05/25/23 1000  Mobility  Activity Ambulated independently in hallway  Level of Assistance Standby assist, set-up cues, supervision of patient - no hands on  Assistive Device None  Distance Ambulated (ft) 680 ft  Activity Response Tolerated well  Mobility Referral Yes  $Mobility charge 1 Mobility  Mobility Specialist Start Time (ACUTE ONLY) 0934  Mobility Specialist Stop Time (ACUTE ONLY) 0946  Mobility Specialist Time Calculation (min) (ACUTE ONLY) 12 min    Pre Mobility: 112 HR During Mobility: 115 HR Post Mobility:  109 HR  Pt received in bed, agreeable to mobility. Asymptomatic w/ no complaints throughout. Returned to room w/o fault. Pt left in bed with call bell and all needs met.  [place]'Vante Earlene Plater Mobility Specialist Please contact via Special educational needs teacher or Rehab office at [phone]

## 2023-05-25 NOTE — Telephone Encounter (Signed)
Fine with me

## 2023-05-25 NOTE — Progress Notes (Signed)
Redness to left forearm iv site  redness subsided. Encouraged pt to apply warm compress when at home for 20 min when needed.

## 2023-05-25 NOTE — Telephone Encounter (Signed)
FYI only for when you meet patient 12/3 - OK to follow with Dr. Kirke Corin thereafter

## 2023-05-25 NOTE — Telephone Encounter (Signed)
Received update from Dr. Odis Hollingshead that patient would like to establish with Dr. Kirke Corin as his dad sees him. The patient was seen by Dr. Carolan Clines this admission as a new patient, but he lives in [place]. Per office protocol, just need the formal OK from both providers to make this switch. The patient has HF TOC f/u on 11/18 then [facility] follow-up 12/3 after which he can follow-up with Dr. Kirke Corin thereafter if MDs approve. Thanks!
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
## 2023-05-25 NOTE — Progress Notes (Signed)
Received update from Dr. Odis Hollingshead that patient would like to establish with Dr. Kirke Corin as his dad sees him. The patient was seen by Dr. Carolan Clines this admission as a new patient, but he lives in [place]. Per office protocol, just need the formal OK from both providers to make this switch -> msg sent to both MDs via phone note. The patient has HF TOC f/u on 11/18 at [facility]. He originally had [place] APP f/u scheduled 12/10 but given that he lives in [place], have moved this to [place] APP on 12/3 after which he can follow-up with Dr. Kirke Corin thereafter if MDs approve.
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
## 2023-05-25 NOTE — Progress Notes (Signed)
Amiodarone gtt site reddened  ,complaining of soreness. Ivf stopped md aware. Iv line removed. Pharmacy aware. Warm compress applied for 20 min. Continue to monitor.

## 2023-05-25 NOTE — Progress Notes (Addendum)
   Heart Failure Stewardship Pharmacist Progress Note   PCP: Mort Sawyers, FNP PCP-Cardiologist: None    HPI:  52 yo M with PMH of HTN, HLD, NASH, GERD, and arthritis.   He was seen by his PCP on 11/6 and was noted to be in afib RVR. Was sent to the ED for evaluation. CXR unremarkable. HR 155. Started on diltiazem gtt and cardiology was consulted. Underwent TEE on 11/7 with planned DCCV. Unfortunately, had evidence of LAA thrombus and DCCV was deferred. EF on TEE 40-45%. Diltiazem gtt has been stopped with new low EF. Repeat ECHO with EF 55%.   Patient and his wife state that he is not always compliant with his medications at home - frequently missing doses of his BP medications and Repatha.   Reports improvement in symptoms. Discharge delayed over the weekend due to tachycardia.   Current HF Medications: Beta Blocker: metoprolol tartrate 100 mg BID ACE/ARB/ARNI: losartan 25 mg daily MRA: spironolactone 25 mg daily SGLT2i: Farxiga 10 mg daily  Prior to admission HF Medications: ACE/ARB/ARNI: lisinopril 10 mg daily  Pertinent Lab Values: As of 11/7: Serum creatinine 1.00, BUN 13, Potassium 4.1, Sodium 137, BNP 249.2, A1c 5.4  Vital Signs: Weight: 240 lbs  Blood pressure: 110/90s  Heart rate: 90-110s  I/O: net -0.8L yesterday; net -4.1L since admission  Medication Assistance / Insurance Benefits Check: Does the patient have prescription insurance?  Yes Type of insurance plan: Hospital doctor  Outpatient Pharmacy:  Prior to admission outpatient [facility] Is the patient willing to use [facility] TOC pharmacy at discharge? Yes Is the patient willing to transition their outpatient pharmacy to utilize a [facility] outpatient pharmacy?   No    Assessment: 1. 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 NYHA class II symptoms. - Not fluid overloaded on exam - Continue metoprolol tartrate 100 mg BID - Continue losartan 25 mg  daily and spironolactone 25 mg daily. - Continue Farxiga 10 mg daily - Encouraged patient to minimize how frequently he is taking meloxicam as outpatient (claims this is the only thing that works for the arthritis in his knee). - Stopping amlodipine on discharge   Plan: 1) Medication changes recommended at this time: - Discharge today  2) Patient assistance: - Jardiance requires prior authorization - Farxiga copay $45 - copay card lowers to $0 - Eliquis copay $45 - copay card lowers to $10 - Entresto copay $45 - copay card lowers to $10 - Provided monthly copay cards for Eliquis, Farxiga, and Entresto as well as a pill box to help with compliance.  - Patient and his wife state that he will be compliant with medications moving forward and understand the risks with not taking medications as prescribed.  3)  Education  - Patient has been educated on current HF medications and potential additions to HF medication regimen - Patient verbalizes understanding that over the next few months, these medication doses may change and more medications may be added to optimize HF regimen - Patient has been educated on basic disease state pathophysiology and goals of therapy - Re-emphasized importance of taking uninterrupted Eliquis   Sharen Hones, PharmD, BCPS Heart Failure Engineer, building services Phone [phone]

## 2023-05-25 NOTE — Plan of Care (Signed)
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
## 2023-05-25 NOTE — Progress Notes (Signed)
As per Case manager, pt has to bring the coupon to his pharmacy for eliquis. Pt instructed.

## 2023-05-26 ENCOUNTER — Telehealth: Payer: Self-pay
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
## 2023-05-26 NOTE — Transitions of Care (Post Inpatient/ED Visit) (Signed)
05/26/2023  Name: Ernest Boyd MRN: [id number] DOB: [birth year]  Today's TOC FU Call Status: Today's TOC FU Call Status:: Successful TOC FU Call Completed TOC FU Call Complete Date: 05/26/23 Patient's Name and Date of Birth confirmed.  Transition Care Management Follow-up Telephone Call Date of Discharge: 05/25/23 Discharge Facility: [facility]) Type of Discharge: Inpatient Admission Primary Inpatient Discharge Diagnosis:: A-fib How have you been since you were released from the hospital?: Better Any questions or concerns?: No  Items Reviewed: Did you receive and understand the discharge instructions provided?: Yes Medications obtained,verified, and reconciled?: Yes (Medications Reviewed) Any new allergies since your discharge?: No Dietary orders reviewed?: NA Do you have support at home?: Yes People in Home: significant other Name of Support/Comfort Primary Source: Graciella Belton  Medications Reviewed Today: Medications Reviewed Today     Reviewed by [facility], RN (Case Manager) on 05/26/23 at 0930  Med List Status: <None>   Medication Order Taking? Sig Documenting Provider Last Dose Status Informant  acetaminophen (TYLENOL) 500 MG tablet [id number] No Take 500 mg by mouth every 6 (six) hours as needed for moderate pain (pain score 4-6). [provider] 05/19/2023 Active Self, Pharmacy Records  albuterol (VENTOLIN HFA) 108 (90 Base) MCG/ACT inhaler [id number] No Inhale 1-2 puffs into the lungs every 4 (four) hours as needed for wheezing or shortness of breath. Domenick Gong, MD 05/18/2023 Active Self, Pharmacy Records  amiodarone (PACERONE) 200 MG tablet [id number]  Take 1 tablet (200 mg total) by mouth daily. Marinda Elk, MD  Active   apixaban (ELIQUIS) 5 MG TABS tablet [id number]  Take 1 tablet (5 mg total) by mouth 2 (two) times daily. Marinda Elk, MD  Active   calcium carbonate (TUMS - DOSED IN MG ELEMENTAL CALCIUM) 500 MG chewable tablet  [id number] No Chew 1 tablet by mouth as needed for indigestion or heartburn. [provider] unknown Active Self, Pharmacy Records  dapagliflozin propanediol (FARXIGA) 10 MG TABS tablet [id number]  Take 1 tablet (10 mg total) by mouth daily. Marinda Elk, MD  Active   Evolocumab [facility]) 140 MG/ML Konrad Penta [id number] No Inject one injection sq every two weeks Mort Sawyers, FNP Past Month Active Self, Pharmacy Records  fluticasone [facility]) 50 MCG/ACT nasal spray [id number] No Place 2 sprays into both nostrils daily.  Patient not taking: Reported on 05/20/2023   Domenick Gong, MD Not Taking Active Self, Pharmacy Records  fluticasone-salmeterol [facility]) 250-50 MCG/ACT AEPB [id number] No Inhale 1 puff into the lungs 2 (two) times daily. in the morning and at bedtime.  Patient not taking: Reported on 05/20/2023   Mort Sawyers, FNP Not Taking Active Self, Pharmacy Records  losartan (COZAAR) 25 MG tablet [id number]  Take 1 tablet (25 mg total) by mouth daily. Marinda Elk, MD  Active   metoprolol tartrate (LOPRESSOR) 100 MG tablet [id number]  Take 1 tablet (100 mg total) by mouth 2 (two) times daily. Marinda Elk, MD  Active   spironolactone (ALDACTONE) 25 MG tablet [id number]  Take 1 tablet (25 mg total) by mouth daily. Marinda Elk, MD  Active             Home Care and Equipment/Supplies: Were Home Health Services Ordered?: NA Any new equipment or medical supplies ordered?: NA  Functional Questionnaire: Do you need assistance with bathing/showering or dressing?: No Do you need assistance with meal preparation?: No Do you need assistance with eating?: No Do you have difficulty maintaining continence: No Do you need assistance  with getting out of bed/getting out of a chair/moving?: No Do you have difficulty managing or taking your medications?: No  Follow up appointments reviewed: PCP Follow-up appointment confirmed?: No MD Provider Line  Number:[phone] Given: No Specialist Hospital Follow-up appointment confirmed?: Yes Date of Specialist follow-up appointment?: 06/01/23 Follow-Up Specialty Provider:: Clarisa Kindred Do you need transportation to your follow-up appointment?: No Do you understand care options if your condition(s) worsen?: Yes-patient verbalized understanding  SDOH Interventions Today    Flowsheet Row Most Recent Value  SDOH Interventions   Food Insecurity Interventions Intervention Not Indicated  Transportation Interventions Intervention Not Indicated      The patient does not wish to enroll in the 30 day Outreach program Deidre Ala, RN RN Care Manager [facility]Population [facility] [phone]
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
## 2023-06-01 ENCOUNTER — Ambulatory Visit: Payer: 59 | Attending: Family | Admitting: Family

## 2023-06-01 ENCOUNTER — Encounter: Payer: Self-pay | Admitting: Family

## 2023-06-01 VITALS — BP 121/93 | HR 59 | Wt 248.0 lb

## 2023-06-01 DIAGNOSIS — I10 Essential (primary) hypertension: Secondary | ICD-10-CM | POA: Diagnosis not present

## 2023-06-01 DIAGNOSIS — I5032 Chronic diastolic (congestive) heart failure: Secondary | ICD-10-CM

## 2023-06-01 DIAGNOSIS — I110 Hypertensive heart disease with heart failure: Secondary | ICD-10-CM | POA: Diagnosis not present

## 2023-06-01 DIAGNOSIS — Z7901 Long term (current) use of anticoagulants: Secondary | ICD-10-CM | POA: Insufficient documentation

## 2023-06-01 DIAGNOSIS — Z79899 Other long term (current) drug therapy: Secondary | ICD-10-CM | POA: Insufficient documentation

## 2023-06-01 DIAGNOSIS — Z7984 Long term (current) use of oral hypoglycemic drugs: Secondary | ICD-10-CM | POA: Diagnosis not present

## 2023-06-01 DIAGNOSIS — R5383 Other fatigue: Secondary | ICD-10-CM | POA: Insufficient documentation

## 2023-06-01 DIAGNOSIS — E785 Hyperlipidemia, unspecified: Secondary | ICD-10-CM | POA: Diagnosis not present

## 2023-06-01 DIAGNOSIS — I428 Other cardiomyopathies: Secondary | ICD-10-CM | POA: Diagnosis not present

## 2023-06-01 DIAGNOSIS — K7581 Nonalcoholic steatohepatitis (NASH): Secondary | ICD-10-CM | POA: Insufficient documentation

## 2023-06-01 DIAGNOSIS — E782 Mixed hyperlipidemia: Secondary | ICD-10-CM

## 2023-06-01 DIAGNOSIS — I4891 Unspecified atrial fibrillation: Secondary | ICD-10-CM | POA: Insufficient documentation

## 2023-06-01 MED ORDER — LOSARTAN POTASSIUM 50 MG PO TABS: 50.0000 mg | ORAL_TABLET | Freq: Every day | ORAL | 3 refills | Status: DC

## 2023-06-01 NOTE — Progress Notes (Signed)
noted 

## 2023-06-01 NOTE — Patient Instructions (Addendum)
It was a pleasure meeting you today.   INCREASE YOUR LOSARTAN TO 50 MG ONCE DAILY  Go DOWN to LOWER LEVEL (LL) to have your blood work completed inside of [employer] office.

## 2023-06-02 LAB — BASIC METABOLIC PANEL
BUN/Creatinine Ratio: 10 (ref 9–20)
BUN: 14 mg/dL (ref 6–24)
CO2: 23 mmol/L (ref 20–29)
Calcium: 9.7 mg/dL (ref 8.7–10.2)
Chloride: 102 mmol/L (ref 96–106)
Creatinine, Ser: 1.37 mg/dL — ABNORMAL HIGH (ref 0.76–1.27)
Glucose: 103 mg/dL — ABNORMAL HIGH (ref 70–99)
Potassium: 4.6 mmol/L (ref 3.5–5.2)
Sodium: 141 mmol/L (ref 134–144)
eGFR: 62 mL/min/{1.73_m2} (ref >59)

## 2023-06-02 LAB — CBC
Hematocrit: 44.6 % (ref 37.5–51.0)
Hemoglobin: 14.9 g/dL (ref 13.0–17.7)
MCH: 28.4 pg (ref 26.6–33.0)
MCHC: 33.4 g/dL (ref 31.5–35.7)
MCV: 85 fL (ref 79–97)
Platelets: 314 10*3/uL (ref 150–450)
RBC: 5.24 x10E6/uL (ref 4.14–5.80)
RDW: 12.6 % (ref 11.6–15.4)
WBC: 7.9 10*3/uL (ref 3.4–10.8)
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
## 2023-06-16 ENCOUNTER — Encounter: Payer: Self-pay | Admitting: Physician Assistant

## 2023-06-16 ENCOUNTER — Ambulatory Visit: Payer: 59 | Attending: Physician Assistant | Admitting: Physician Assistant

## 2023-06-16 VITALS — BP 129/89 | HR 102 | Ht 71.0 in | Wt 242.2 lb

## 2023-06-16 DIAGNOSIS — E782 Mixed hyperlipidemia: Secondary | ICD-10-CM | POA: Diagnosis not present

## 2023-06-16 DIAGNOSIS — I5032 Chronic diastolic (congestive) heart failure: Secondary | ICD-10-CM | POA: Diagnosis not present

## 2023-06-16 DIAGNOSIS — Z789 Other specified health status: Secondary | ICD-10-CM

## 2023-06-16 DIAGNOSIS — I4819 Other persistent atrial fibrillation: Secondary | ICD-10-CM

## 2023-06-16 DIAGNOSIS — I10 Essential (primary) hypertension: Secondary | ICD-10-CM | POA: Diagnosis not present

## 2023-06-16 DIAGNOSIS — G4730 Sleep apnea, unspecified: Secondary | ICD-10-CM

## 2023-06-16 NOTE — Patient Instructions (Signed)
Medication Instructions:  Your physician recommends the following medication changes.  STOP TAKING: Amiodarone    *If you need a refill on your cardiac medications before your next appointment, please call your pharmacy*   Lab Work: Your provider would like for you to have following labs drawn today BMP and CBC.   If you have labs (blood work) drawn today and your tests are completely normal, you will receive your results only by: MyChart Message (if you have MyChart) OR A paper copy in the mail If you have any lab test that is abnormal or we need to change your treatment, we will call you to review the results.   Testing/Procedures:   Dear Ernest Boyd  You are scheduled for a TEE (Transesophageal Echocardiogram) Guided Cardioversion on Friday, December 6 with Dr. Concha Se.  Please arrive at the [facility] Entrance of [facility], [address], [place] [address] at 6:30 AM (This is 1 hour(s) prior to your procedure time).  Proceed to the Check-In Desk directly inside the entrance.  Procedure Parking: Use the entrance off of the [facility] [address] side of the hospital. Turn right upon entering and follow the driveway to parking that is directly in front of the [facility]. There is no valet parking available at this entrance, however there is an awning directly in front of the [facility] Center for drop off/ pick up for patients.   DIET:  Nothing to eat or drink after midnight except a sip of water with medications (see medication instructions below)  MEDICATION INSTRUCTIONS: !!IF ANY NEW MEDICATIONS ARE STARTED AFTER TODAY, PLEASE NOTIFY YOUR PROVIDER AS SOON AS POSSIBLE!!  FYI: Medications such as Semaglutide (Ozempic, [place]), Tirzepatide (Mounjaro, Zepbound), Dulaglutide (Trulicity), etc ("GLP1 agonists") AND Canagliflozin (Invokana), Dapagliflozin (Farxiga), Empagliflozin (Jardiance), Ertugliflozin (Steglatro), Bexagliflozin [employer]) or any combination  with one of these drugs such as Invokamet (Canagliflozin/Metformin), Synjardy (Empagliflozin/Metformin), etc ("SGLT2 inhibitors") must be held around the time of a procedure. This is not a comprehensive list of all of these drugs. Please review all of your medications and talk to your provider if you take any one of these. If you are not sure, ask your provider.   HOLD: Dapagliflozin Ernest Boyd) for 3 day prior to the procedure. Last dose on Tuesday, December 03.   Continue taking your anticoagulant (blood thinner): Apixaban (Eliquis).  You will need to continue this after your procedure until you are told by your provider that it is safe to stop.     FYI:  For your safety, and to allow [place] to monitor your vital signs accurately during the surgery/procedure we request: If you have artificial nails, gel coating, SNS etc, please have those removed prior to your surgery/procedure. Not having the nail coverings /polish removed may result in cancellation or delay of your surgery/procedure.  You must have a responsible person to drive you home and stay in the waiting area during your procedure. Failure to do so could result in cancellation.  Bring your insurance cards.  *Special Note: Every effort is made to have your procedure done on time. Occasionally there are emergencies that occur at the hospital that may cause delays. Please be patient if a delay does occur.     Follow-Up: At [facility], you and your health needs are our priority.  As part of our continuing mission to provide you with exceptional heart care, we have created designated Provider Care Teams.  These Care Teams include your  primary Cardiologist (physician) and Advanced Practice Providers (APPs -  Physician Assistants and Nurse Practitioners) who all work together to provide you with the care you need, when you need it.  We recommend signing up for the patient portal called "MyChart".  Sign up information is provided on  this After Visit Summary.  MyChart is used to connect with patients for Virtual Visits (Telemedicine).  Patients are able to view lab/test results, encounter notes, upcoming appointments, etc.  Non-urgent messages can be sent to your provider as well.   To learn more about what you can do with MyChart, go to [email or web address].    Your next appointment:   1- 2 month(s)  Provider:   Lorine Bears, MD  to establish care

## 2023-06-17 ENCOUNTER — Ambulatory Visit (HOSPITAL_BASED_OUTPATIENT_CLINIC_OR_DEPARTMENT_OTHER): Payer: 59 | Admitting: Orthopaedic Surgery

## 2023-06-17 LAB — CBC
Hematocrit: 49.4 % (ref 37.5–51.0)
Hemoglobin: 16.5 g/dL (ref 13.0–17.7)
MCH: 28.4 pg (ref 26.6–33.0)
MCHC: 33.4 g/dL (ref 31.5–35.7)
MCV: 85 fL (ref 79–97)
Platelets: 252 10*3/uL (ref 150–450)
RBC: 5.8 x10E6/uL (ref 4.14–5.80)
RDW: 13.2 % (ref 11.6–15.4)
WBC: 6.7 10*3/uL (ref 3.4–10.8)

## 2023-06-17 LAB — BASIC METABOLIC PANEL
BUN/Creatinine Ratio: 10 (ref 9–20)
BUN: 12 mg/dL (ref 6–24)
CO2: 22 mmol/L (ref 20–29)
Calcium: 9.6 mg/dL (ref 8.7–10.2)
Chloride: 100 mmol/L (ref 96–106)
Creatinine, Ser: 1.25 mg/dL (ref 0.76–1.27)
Glucose: 95 mg/dL (ref 70–99)
Potassium: 4.6 mmol/L (ref 3.5–5.2)
Sodium: 137 mmol/L (ref 134–144)
eGFR: 69 mL/min/{1.73_m2} (ref >59)

## 2023-06-18 MED ORDER — SODIUM CHLORIDE 0.9 % IV SOLN: INTRAVENOUS | Status: DC

## 2023-06-19 ENCOUNTER — Encounter: Payer: Self-pay | Admitting: Cardiovascular Disease

## 2023-06-19 ENCOUNTER — Other Ambulatory Visit: Payer: Self-pay

## 2023-06-19 ENCOUNTER — Encounter
Admission: RE | Disposition: A | Discharge status: Home / Self Care | Payer: Self-pay | Source: Home / Self Care | Attending: Cardiovascular Disease

## 2023-06-19 ENCOUNTER — Ambulatory Visit (HOSPITAL_BASED_OUTPATIENT_CLINIC_OR_DEPARTMENT_OTHER)
Admission: RE | Admit: 2023-06-19 | Discharge: 2023-06-19 | Disposition: A | Discharge status: Home / Self Care | Payer: 59 | Source: Home / Self Care | Attending: Cardiovascular Disease | Admitting: Cardiovascular Disease

## 2023-06-19 ENCOUNTER — Ambulatory Visit: Payer: 59 | Admitting: Anesthesiology

## 2023-06-19 ENCOUNTER — Ambulatory Visit
Admission: RE | Admit: 2023-06-19 | Discharge: 2023-06-19 | Disposition: A | Discharge status: Home / Self Care | Payer: 59 | Attending: Cardiovascular Disease | Admitting: Cardiovascular Disease

## 2023-06-19 DIAGNOSIS — Z79899 Other long term (current) drug therapy: Secondary | ICD-10-CM | POA: Insufficient documentation

## 2023-06-19 DIAGNOSIS — I340 Nonrheumatic mitral (valve) insufficiency: Secondary | ICD-10-CM | POA: Diagnosis not present

## 2023-06-19 DIAGNOSIS — I5022 Chronic systolic (congestive) heart failure: Secondary | ICD-10-CM | POA: Insufficient documentation

## 2023-06-19 DIAGNOSIS — I110 Hypertensive heart disease with heart failure: Secondary | ICD-10-CM | POA: Insufficient documentation

## 2023-06-19 DIAGNOSIS — I4892 Unspecified atrial flutter: Secondary | ICD-10-CM | POA: Insufficient documentation

## 2023-06-19 DIAGNOSIS — R0602 Shortness of breath: Secondary | ICD-10-CM | POA: Diagnosis present

## 2023-06-19 DIAGNOSIS — I4891 Unspecified atrial fibrillation: Secondary | ICD-10-CM | POA: Diagnosis not present

## 2023-06-19 DIAGNOSIS — E785 Hyperlipidemia, unspecified: Secondary | ICD-10-CM | POA: Diagnosis not present

## 2023-06-19 DIAGNOSIS — I420 Dilated cardiomyopathy: Secondary | ICD-10-CM

## 2023-06-19 DIAGNOSIS — G4730 Sleep apnea, unspecified: Secondary | ICD-10-CM | POA: Insufficient documentation

## 2023-06-19 DIAGNOSIS — Z7901 Long term (current) use of anticoagulants: Secondary | ICD-10-CM | POA: Insufficient documentation

## 2023-06-19 DIAGNOSIS — I4819 Other persistent atrial fibrillation: Secondary | ICD-10-CM | POA: Diagnosis present

## 2023-06-19 DIAGNOSIS — K219 Gastro-esophageal reflux disease without esophagitis: Secondary | ICD-10-CM | POA: Insufficient documentation

## 2023-06-19 DIAGNOSIS — I361 Nonrheumatic tricuspid (valve) insufficiency: Secondary | ICD-10-CM

## 2023-06-19 HISTORY — PX: TEE WITHOUT CARDIOVERSION: SHX5443

## 2023-06-19 HISTORY — PX: CARDIOVERSION: SHX1299

## 2023-06-19 LAB — ECHO TEE

## 2023-06-19 SURGERY — TRANSESOPHAGEAL ECHOCARDIOGRAM (TEE)
Anesthesia: General

## 2023-06-19 MED ORDER — LIDOCAINE HCL (CARDIAC) PF 100 MG/5ML IV SOSY
PREFILLED_SYRINGE | INTRAVENOUS | Status: DC | PRN
  Administered 2023-06-19: 100 mg via INTRAVENOUS

## 2023-06-19 MED ORDER — LIDOCAINE VISCOUS HCL 2 % MT SOLN
OROMUCOSAL | Status: AC
  Filled 2023-06-19: qty 15

## 2023-06-19 MED ORDER — PROPOFOL 10 MG/ML IV BOLUS
INTRAVENOUS | Status: DC | PRN
  Administered 2023-06-19: 40 mg via INTRAVENOUS
  Administered 2023-06-19: 120 mg via INTRAVENOUS
  Administered 2023-06-19 (×2): 40 mg via INTRAVENOUS

## 2023-06-19 MED ORDER — BUTAMBEN-TETRACAINE-BENZOCAINE 2-2-14 % EX AERO
INHALATION_SPRAY | CUTANEOUS | Status: AC
  Filled 2023-06-19: qty 5

## 2023-06-19 NOTE — CV Procedure (Signed)
Cardioversion procedure note For atrial fibrillation with RVR.  Procedure Details:  Consent: Risks of procedure as well as the alternatives and risks of each were explained to the (patient/caregiver).  Consent for procedure obtained.  Time Out: Verified patient identification, verified procedure, site/side was marked, verified correct patient position, special equipment/implants available, medications/allergies/relevent history reviewed, required imaging and test results available.  Performed  Patient placed on cardiac monitor, pulse oximetry, supplemental oxygen as necessary.   Sedation given: propofol IV, Dr. Darleene Cleaver Pacer pads placed anterior and posterior chest.   Cardioverted 1 time(s).   Cardioverted at  150 J. Synchronized biphasic Converted to NSR   Evaluation: Findings: Post procedure EKG shows: NSR Complications: None Patient did tolerate procedure well.  Time Spent Directly with the Patient:  45 minutes   Dossie Arbour, M.D., Ph.D.

## 2023-06-19 NOTE — Transfer of Care (Signed)
Immediate Anesthesia Transfer of Care Note  Patient: Ernest Boyd  Procedure(s) Performed: TRANSESOPHAGEAL ECHOCARDIOGRAM (TEE) CARDIOVERSION  Patient Location: Short Stay  Anesthesia Type:General  Level of Consciousness: drowsy  Airway & Oxygen Therapy: Patient Spontanous Breathing and Patient connected to nasal cannula oxygen  Post-op Assessment: Report given to RN, Post -op Vital signs reviewed and stable, and Patient moving all extremities  Post vital signs: Reviewed and stable  Last Vitals:  Vitals Value Taken Time  BP 102/75 06/19/23 0757  Temp    Pulse 55 06/19/23 0800  Resp 16 06/19/23 0800  SpO2 97 % 06/19/23 0800    Last Pain:  Vitals:   06/19/23 0704  TempSrc: Oral  PainSc: 0-No pain         Complications: No notable events documented.

## 2023-06-19 NOTE — Anesthesia Postprocedure Evaluation (Signed)
Anesthesia Post Note  Patient: Ernest Boyd  Procedure(s) Performed: TRANSESOPHAGEAL ECHOCARDIOGRAM (TEE) CARDIOVERSION  Patient location during evaluation: PACU Anesthesia Type: General Level of consciousness: awake Pain management: satisfactory to patient Vital Signs Assessment: post-procedure vital signs reviewed and stable Cardiovascular status: stable Anesthetic complications: no   No notable events documented.   Last Vitals:  Vitals:   06/19/23 0800 06/19/23 0805  BP:  105/71  Pulse: (!) 55 (!) 57  Resp: 16 15  Temp:    SpO2: 97% 98%    Last Pain:  Vitals:   06/19/23 0704  TempSrc: Oral  PainSc: 0-No pain                 VAN STAVEREN,Addison Freimuth

## 2023-06-19 NOTE — Interval H&P Note (Signed)
History and Physical Interval Note:  06/19/2023 6:14 PM  Ernest Boyd  has presented today for surgery, with the diagnosis of TEE and Cardioversion   Afib OK [facility]ini Anesthesia   Start time 7:30a.  The various methods of treatment have been discussed with the patient and family. After consideration of risks, benefits and other options for treatment, the patient has consented to  Procedure(s): TRANSESOPHAGEAL ECHOCARDIOGRAM (TEE) (N/A) CARDIOVERSION (N/A) as a surgical intervention.  The patient's history has been reviewed, patient examined, no change in status, stable for surgery.  I have reviewed the patient's chart and labs.  Questions were answered to the patient's satisfaction.     Julien Nordmann

## 2023-06-19 NOTE — H&P (Signed)
H&P Addendum, pre-TEE and cardioversion  Patient was seen and evaluated prior to transesophageal echo procedure Symptoms, prior testing details again confirmed with the patient Patient examined, no significant change from prior exam Lab work reviewed in detail personally by myself After careful review of history and examination, the risks and benefits of transesophageal echocardiogram have been explained including risks of esophageal damage, perforation (1:10,000 risk), bleeding, pharyngeal hematoma as well as other potential complications associated with conscious sedation including aspiration, arrhythmia, respiratory failure and death. Alternatives to treatment were discussed, questions were answered.   seen and evaluated prior to -cardioversion procedure Symptoms, prior testing details again confirmed with the patient Patient examined, no significant change from prior exam Lab work reviewed in detail personally by myself Patient understands risk and benefit of the procedure,  The risks (stroke, cardiac arrhythmias rarely resulting in the need for a temporary or permanent pacemaker, skin irritation or burns and complications associated with conscious sedation including aspiration, arrhythmia, respiratory failure and death), benefits (restoration of normal sinus rhythm) and alternatives of a direct current cardioversion were explained in detail Patient willing to proceed.  Signed, Dossie Arbour, MD, Ph.D [facility]

## 2023-06-19 NOTE — Progress Notes (Signed)
Transesophageal Echocardiogram :  Indication: Atrial fibrillation with RVR Requesting/ordering  physician: Eula Listen  Procedure: Benzocaine spray x2 and 2 mls x 2 of viscous lidocaine were given orally to provide local anesthesia to the oropharynx. The patient was positioned supine on the left side, bite block provided. The patient was moderately sedated with the doses of versed and fentanyl as detailed below.  Using digital technique an omniplane probe was advanced into the distal esophagus without incident.   Moderate sedation: 1. Sedation used: Per anesthesia  See report in EPIC  for complete details: In brief, transgastric imaging revealed moderately reduced LV function with global hypokinesis, and no mural apical thrombus.  .  Estimated ejection fraction was 35%.  Right sided cardiac chambers are normal size with mildly reduced function, with no evidence of pulmonary hypertension.  Imaging of the septum showed no ASD or VSD Bubble study was negative for shunt 2D and color flow confirmed no PFO  The LA was well visualized in orthogonal views.  There was no spontaneous contrast and no thrombus in the LA and LA appendage   The descending thoracic aorta had no  mural aortic debris with no evidence of aneurysmal dilation or disection  Cardioversion to follow  Julien Nordmann 06/19/2023 12:27 PM
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
## 2023-06-19 NOTE — Progress Notes (Signed)
*  PRELIMINARY RESULTS* Echocardiogram Echocardiogram Transesophageal has been performed.  Ernest Boyd 06/19/2023, 8:05 AM

## 2023-06-23 ENCOUNTER — Ambulatory Visit: Payer: 59 | Admitting: General Practice

## 2023-06-25 ENCOUNTER — Other Ambulatory Visit: Payer: Self-pay

## 2023-06-25 MED ORDER — SPIRONOLACTONE 25 MG PO TABS: 25.0000 mg | ORAL_TABLET | Freq: Every day | ORAL | 0 refills | Status: DC

## 2023-06-25 MED ORDER — METOPROLOL TARTRATE 100 MG PO TABS: 100.0000 mg | ORAL_TABLET | Freq: Two times a day (BID) | ORAL | 0 refills | Status: DC

## 2023-06-25 MED ORDER — APIXABAN 5 MG PO TABS: 5.0000 mg | ORAL_TABLET | Freq: Two times a day (BID) | ORAL | 0 refills | Status: DC

## 2023-06-25 NOTE — Progress Notes (Signed)
noted 

## 2023-06-29 ENCOUNTER — Encounter: Payer: Self-pay | Admitting: Family

## 2023-06-29 ENCOUNTER — Ambulatory Visit: Payer: 59 | Attending: Family | Admitting: Family

## 2023-06-29 ENCOUNTER — Other Ambulatory Visit: Payer: Self-pay

## 2023-06-29 VITALS — BP 127/107 | HR 143 | Wt 255.2 lb

## 2023-06-29 DIAGNOSIS — E782 Mixed hyperlipidemia: Secondary | ICD-10-CM

## 2023-06-29 DIAGNOSIS — R9431 Abnormal electrocardiogram [ECG] [EKG]: Secondary | ICD-10-CM | POA: Diagnosis not present

## 2023-06-29 DIAGNOSIS — I5032 Chronic diastolic (congestive) heart failure: Secondary | ICD-10-CM | POA: Diagnosis not present

## 2023-06-29 DIAGNOSIS — I4819 Other persistent atrial fibrillation: Secondary | ICD-10-CM | POA: Insufficient documentation

## 2023-06-29 DIAGNOSIS — E785 Hyperlipidemia, unspecified: Secondary | ICD-10-CM | POA: Insufficient documentation

## 2023-06-29 DIAGNOSIS — K7581 Nonalcoholic steatohepatitis (NASH): Secondary | ICD-10-CM | POA: Insufficient documentation

## 2023-06-29 DIAGNOSIS — I428 Other cardiomyopathies: Secondary | ICD-10-CM | POA: Insufficient documentation

## 2023-06-29 DIAGNOSIS — I110 Hypertensive heart disease with heart failure: Secondary | ICD-10-CM | POA: Insufficient documentation

## 2023-06-29 DIAGNOSIS — I10 Essential (primary) hypertension: Secondary | ICD-10-CM

## 2023-06-29 DIAGNOSIS — Z79899 Other long term (current) drug therapy: Secondary | ICD-10-CM | POA: Insufficient documentation

## 2023-06-29 MED ORDER — AMIODARONE HCL 200 MG PO TABS: ORAL_TABLET | ORAL | 0 refills | Status: DC

## 2023-06-29 NOTE — Patient Instructions (Signed)
Medication Changes:  START: AMIODARONE 400MG  TWICE DAILY FOR 7 DAYS   THEN REDUCE TO 200MG  TWICE DAILY FOR 7 DAYS   THEN REDUCE TO 200MG  ONCE DAILY   Lab Work:  Go DOWN to LOWER LEVEL (LL) to have your blood work completed inside of [employer] office.  We will only call you if the results are abnormal or if the provider would like to make medication changes.  Testing/Procedures:  Your physician has recommended that you have a Cardioversion (DCCV). Electrical Cardioversion uses a jolt of electricity to your heart either through paddles or wired patches attached to your chest. This is a controlled, usually prescheduled, procedure. Defibrillation is done under light anesthesia in the hospital, and you usually go home the day of the procedure. This is done to get your heart back into a normal rhythm. You are not awake for the procedure. Please see the instruction sheet given to you today.  Referrals:  YOU HAVE BEEN REFERRED TO ELECTROPHYSIOLOGY THEY WILL REACH OUT TO YOU OR CALL TO ARRANGE THIS. PLEASE CALL us WITH ANY CONCERNS   Special Instructions // Education:  Dear [name]  You are scheduled for a Cardioversion on Tuesday, December 17 with Dr. Gala Romney .  Please arrive at the [facility] (Main Entrance A) at [facility]: [address] [place], [place] [address] at 6:30 AM (This time is 1 hour(s) before your procedure to ensure your preparation).   Free valet parking service is available. You will check in at ADMITTING.   *Please Note: You will receive a call the day before your procedure to confirm the appointment time. That time may have changed from the original time based on the schedule for that day.*   DIET:  Nothing to eat or drink after midnight except a sip of water with medications (see medication instructions below)  MEDICATION INSTRUCTIONS: !!IF ANY NEW MEDICATIONS ARE STARTED AFTER TODAY, PLEASE NOTIFY YOUR PROVIDER AS SOON AS POSSIBLE!!  FYI: Medications  such as Semaglutide (Ozempic, [place]), Tirzepatide (Mounjaro, Zepbound), Dulaglutide (Trulicity), etc ("GLP1 agonists") AND Canagliflozin (Invokana), Dapagliflozin (Farxiga), Empagliflozin (Jardiance), Ertugliflozin (Steglatro), Bexagliflozin [employer]) or any combination with one of these drugs such as Invokamet (Canagliflozin/Metformin), Synjardy (Empagliflozin/Metformin), etc ("SGLT2 inhibitors") must be held around the time of a procedure. This is not a comprehensive list of all of these drugs. Please review all of your medications and talk to your provider if you take any one of these. If you are not sure, ask your provider.    HOLD: DO NOT TAKE FARXIGA THE MORNING OF PROCEDURE   DO NOT TAKE- SPIRONOLACTONE THE MORNING OF PROCEDURE   MAKE SURE TO STILL TAKE ELIQUIS    Apixaban (Eliquis).  You will need to continue this after your procedure until you are told by your provider that it is safe to stop.    LABS: TODAY    FYI:  For your safety, and to allow [place] to monitor your vital signs accurately during the surgery/procedure we request: If you have artificial nails, gel coating, SNS etc, please have those removed prior to your surgery/procedure. Not having the nail coverings /polish removed may result in cancellation or delay of your surgery/procedure.  Your support person will be asked to wait in the waiting room during your procedure.  It is OK to have someone drop you off and come back when you are ready to be discharged.  You cannot drive after the procedure and will need someone to drive you home.  Bring  your insurance cards.  *Special Note: Every effort is made to have your procedure done on time. Occasionally there are emergencies that occur at the hospital that may cause delays. Please be patient if a delay does occur.   Follow-Up in: 2 WEEKS AS SCHEDULED   If you have any questions or concerns before your next appointment please send [place] a message through mychart or call our  office at [phone] Monday-Friday 8 am-5 pm.   If you have an urgent need after hours on the weekend please call your Primary Cardiologist or the [facility] in [place] at [phone].   At the [facility], you and your health needs are our priority. We have a designated team specialized in the treatment of Heart Failure. This Care Team includes your primary Heart Failure Specialized Cardiologist (physician), Advanced Practice Providers (APPs- Physician Assistants and Nurse Practitioners), and Pharmacist who all work together to provide you with the care you need, when you need it.   You may see any of the following providers on your designated Care Team at your next follow up:  Dr. Arvilla Meres Dr. Marca Ancona Dr. Dorthula Nettles Dr. Theresia Bough Tonye Becket, NP Robbie Lis, [place] [address] [place], [place] Brynda Peon, NP [place] Abdul, NP Clarisa Kindred, NP Enos Fling, PharmD
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
## 2023-06-29 NOTE — Progress Notes (Signed)
error 

## 2023-06-30 ENCOUNTER — Ambulatory Visit (HOSPITAL_COMMUNITY): Payer: 59 | Admitting: Anesthesiology

## 2023-06-30 ENCOUNTER — Encounter (HOSPITAL_COMMUNITY)
Admission: RE | Disposition: A | Discharge status: Home / Self Care | Payer: Self-pay | Source: Home / Self Care | Attending: Internal Medicine

## 2023-06-30 ENCOUNTER — Other Ambulatory Visit: Payer: Self-pay

## 2023-06-30 ENCOUNTER — Ambulatory Visit (HOSPITAL_COMMUNITY)
Admission: RE | Admit: 2023-06-30 | Discharge: 2023-06-30 | Disposition: A | Discharge status: Home / Self Care | Payer: 59 | Attending: Internal Medicine | Admitting: Internal Medicine

## 2023-06-30 DIAGNOSIS — I428 Other cardiomyopathies: Secondary | ICD-10-CM | POA: Insufficient documentation

## 2023-06-30 DIAGNOSIS — I4819 Other persistent atrial fibrillation: Secondary | ICD-10-CM | POA: Diagnosis present

## 2023-06-30 DIAGNOSIS — I10 Essential (primary) hypertension: Secondary | ICD-10-CM | POA: Insufficient documentation

## 2023-06-30 DIAGNOSIS — E785 Hyperlipidemia, unspecified: Secondary | ICD-10-CM | POA: Insufficient documentation

## 2023-06-30 DIAGNOSIS — Z7901 Long term (current) use of anticoagulants: Secondary | ICD-10-CM | POA: Insufficient documentation

## 2023-06-30 DIAGNOSIS — I4891 Unspecified atrial fibrillation: Secondary | ICD-10-CM

## 2023-06-30 DIAGNOSIS — K7581 Nonalcoholic steatohepatitis (NASH): Secondary | ICD-10-CM | POA: Insufficient documentation

## 2023-06-30 DIAGNOSIS — Z79899 Other long term (current) drug therapy: Secondary | ICD-10-CM | POA: Diagnosis not present

## 2023-06-30 HISTORY — PX: CARDIOVERSION: EP1203

## 2023-06-30 LAB — BASIC METABOLIC PANEL
BUN/Creatinine Ratio: 10 (ref 9–20)
BUN: 11 mg/dL (ref 6–24)
CO2: 22 mmol/L (ref 20–29)
Calcium: 9.7 mg/dL (ref 8.7–10.2)
Chloride: 103 mmol/L (ref 96–106)
Creatinine, Ser: 1.11 mg/dL (ref 0.76–1.27)
Glucose: 101 mg/dL — ABNORMAL HIGH (ref 70–99)
Potassium: 4.4 mmol/L (ref 3.5–5.2)
Sodium: 140 mmol/L (ref 134–144)
eGFR: 79 mL/min/{1.73_m2} (ref >59)

## 2023-06-30 LAB — CBC
Hematocrit: 46.5 % (ref 37.5–51.0)
Hemoglobin: 15.7 g/dL (ref 13.0–17.7)
MCH: 28.9 pg (ref 26.6–33.0)
MCHC: 33.8 g/dL (ref 31.5–35.7)
MCV: 86 fL (ref 79–97)
Platelets: 262 10*3/uL (ref 150–450)
RBC: 5.44 x10E6/uL (ref 4.14–5.80)
RDW: 13.6 % (ref 11.6–15.4)
WBC: 7.9 10*3/uL (ref 3.4–10.8)

## 2023-06-30 SURGERY — CARDIOVERSION (CATH LAB)
Anesthesia: General

## 2023-06-30 MED ORDER — APIXABAN 5 MG PO TABS
5.0000 mg | ORAL_TABLET | Freq: Once | ORAL | Status: AC
Start: 2023-06-30 — End: 2023-06-30
  Administered 2023-06-30: 5 mg via ORAL

## 2023-06-30 MED ORDER — PROPOFOL 10 MG/ML IV BOLUS
INTRAVENOUS | Status: DC | PRN
  Administered 2023-06-30: 100 mg via INTRAVENOUS

## 2023-06-30 MED ORDER — SODIUM CHLORIDE 0.9% FLUSH: 10.0000 mL | Freq: Two times a day (BID) | INTRAVENOUS | Status: DC

## 2023-06-30 MED ORDER — APIXABAN 5 MG PO TABS
ORAL_TABLET | ORAL | Status: AC
  Filled 2023-06-30: qty 1

## 2023-06-30 SURGICAL SUPPLY — 1 items: PAD DEFIB RADIO PHYSIO CONN (PAD) ×1 IMPLANT

## 2023-06-30 NOTE — Transfer of Care (Signed)
Immediate Anesthesia Transfer of Care Note  Patient: Ernest Boyd  Procedure(s) Performed: CARDIOVERSION  Patient Location: Cath Lab  Anesthesia Type:General  Level of Consciousness: sedated  Airway & Oxygen Therapy: Patient Spontanous Breathing and Patient connected to nasal cannula oxygen  Post-op Assessment: Report given to RN and Post -op Vital signs reviewed and stable  Post vital signs: Reviewed and stable  Last Vitals:  Vitals Value Taken Time  BP 124/101   Temp    Pulse 56   Resp 15   SpO2 98     Last Pain:  Vitals:   06/30/23 0709  TempSrc:   PainSc: 0-No pain         Complications: No notable events documented.

## 2023-06-30 NOTE — Anesthesia Postprocedure Evaluation (Signed)
Anesthesia Post Note  Patient: Ernest Boyd  Procedure(s) Performed: CARDIOVERSION     Patient location during evaluation: PACU Anesthesia Type: General Level of consciousness: awake and alert Pain management: pain level controlled Vital Signs Assessment: post-procedure vital signs reviewed and stable Respiratory status: spontaneous breathing, nonlabored ventilation and respiratory function stable Cardiovascular status: blood pressure returned to baseline and stable Postop Assessment: no apparent nausea or vomiting Anesthetic complications: no   No notable events documented.  Last Vitals:  Vitals:   06/30/23 0759 06/30/23 0802  BP: 112/89 (!) 123/90  Pulse: 60 60  Resp:    Temp:    SpO2:      Last Pain:  Vitals:   06/30/23 0751  TempSrc: Temporal  PainSc: 0-No pain                 Lowella Curb

## 2023-06-30 NOTE — Interval H&P Note (Signed)
History and Physical Interval Note:  06/30/2023 7:55 AM  Ernest Boyd  has presented today for surgery, with the diagnosis of afib.  The various methods of treatment have been discussed with the patient and family. After consideration of risks, benefits and other options for treatment, the patient has consented to  Procedure(s): CARDIOVERSION (N/A) as a surgical intervention.  The patient's history has been reviewed, patient examined, no change in status, stable for surgery.  I have reviewed the patient's chart and labs.  Questions were answered to the patient's satisfaction.     [name]
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
## 2023-06-30 NOTE — CV Procedure (Signed)
    DIRECT CURRENT CARDIOVERSION  NAME:  Ernest Boyd   MRN: [id number] DOB:  1971-04-14   ADMIT DATE: 06/30/2023   INDICATIONS: Atrial fibrillation    PROCEDURE:   Informed consent was obtained prior to the procedure. The risks, benefits and alternatives for the procedure were discussed and the patient comprehended these risks. Once an appropriate time out was taken, the patient had the defibrillator pads placed in the anterior and posterior position. The patient then underwent sedation by the anesthesia service. Once an appropriate level of sedation was achieved, the patient received a single biphasic, synchronized 200J shock with prompt conversion to sinus rhythm. No apparent complications.  Arvilla Meres, MD  7:56 AM

## 2023-07-01 ENCOUNTER — Encounter: Payer: Self-pay | Admitting: Family

## 2023-07-01 ENCOUNTER — Encounter (HOSPITAL_COMMUNITY): Payer: Self-pay | Admitting: Internal Medicine

## 2023-07-01 ENCOUNTER — Telehealth: Payer: Self-pay | Admitting: Family

## 2023-07-01 NOTE — Telephone Encounter (Signed)
Spoke with patient on phone. He reports that he does not believe that he is back in afib, but that he feels more fatigue and shortness of breath than he did after his previous cardioversion. He also reports more dizziness. He reports blood pressure and pulse stable at home. Home pulse ox showing pulse of 77. Patient scheduled to see Dr. Gasper Lloyd in office tomorrow morning, per [facility]. Pt instructed to go to the Emergency room if his symptoms worsen or change before his appt. Pt agreeable, and verbalized understanding.

## 2023-07-01 NOTE — Progress Notes (Signed)
noted 

## 2023-07-01 NOTE — Telephone Encounter (Signed)
Please advise 

## 2023-07-01 NOTE — Telephone Encounter (Signed)
Pt confirmed appt for 07/02/23

## 2023-07-02 ENCOUNTER — Other Ambulatory Visit: Payer: Self-pay

## 2023-07-02 ENCOUNTER — Other Ambulatory Visit (HOSPITAL_COMMUNITY): Payer: Self-pay

## 2023-07-02 ENCOUNTER — Ambulatory Visit: Payer: 59 | Attending: Cardiology | Admitting: Cardiology

## 2023-07-02 VITALS — BP 142/117 | HR 121 | Wt 247.0 lb

## 2023-07-02 DIAGNOSIS — Z79899 Other long term (current) drug therapy: Secondary | ICD-10-CM | POA: Diagnosis not present

## 2023-07-02 DIAGNOSIS — I4891 Unspecified atrial fibrillation: Secondary | ICD-10-CM | POA: Diagnosis not present

## 2023-07-02 DIAGNOSIS — I110 Hypertensive heart disease with heart failure: Secondary | ICD-10-CM | POA: Insufficient documentation

## 2023-07-02 DIAGNOSIS — I4892 Unspecified atrial flutter: Secondary | ICD-10-CM | POA: Insufficient documentation

## 2023-07-02 DIAGNOSIS — E669 Obesity, unspecified: Secondary | ICD-10-CM | POA: Diagnosis not present

## 2023-07-02 DIAGNOSIS — R0609 Other forms of dyspnea: Secondary | ICD-10-CM | POA: Diagnosis not present

## 2023-07-02 DIAGNOSIS — I10 Essential (primary) hypertension: Secondary | ICD-10-CM

## 2023-07-02 DIAGNOSIS — E785 Hyperlipidemia, unspecified: Secondary | ICD-10-CM | POA: Insufficient documentation

## 2023-07-02 DIAGNOSIS — I5022 Chronic systolic (congestive) heart failure: Secondary | ICD-10-CM | POA: Diagnosis present

## 2023-07-02 DIAGNOSIS — Z7901 Long term (current) use of anticoagulants: Secondary | ICD-10-CM | POA: Diagnosis not present

## 2023-07-02 MED ORDER — FUROSEMIDE 20 MG PO TABS: 20.0000 mg | ORAL_TABLET | Freq: Every day | ORAL | 3 refills | Status: DC | PRN

## 2023-07-02 MED ORDER — ENTRESTO 49-51 MG PO TABS: 1.0000 | ORAL_TABLET | Freq: Two times a day (BID) | ORAL | 6 refills | Status: DC

## 2023-07-02 NOTE — Addendum Note (Signed)
Addended by: Bea Laura B on: 07/02/2023 11:18 AM   Modules accepted: Orders

## 2023-07-02 NOTE — Progress Notes (Signed)
Orders placed for DCCV scheduled for 12/30 with Dr. Gasper Lloyd.
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
## 2023-07-02 NOTE — Patient Instructions (Addendum)
Medication Changes:  STOP LOSARTAN   START: ENTRESTO  49/51MG  TWICE DAILY- use copay card provided   TAKE AMIODARONE 400MG  TWICE DAILY FOR 10 DAYS  START: LASIX (FUROSEMIDE) 20MG  ONCE DAILY AS NEEDED   Testing/Procedures:  Your physician has recommended that you have a Cardioversion (DCCV). Electrical Cardioversion uses a jolt of electricity to your heart either through paddles or wired patches attached to your chest. This is a controlled, usually prescheduled, procedure. Defibrillation is done under light anesthesia in the hospital, and you usually go home the day of the procedure. This is done to get your heart back into a normal rhythm. You are not awake for the procedure. Please see the instruction sheet given to you today.  Special Instructions // Education:  Dear Ernest Boyd  You are scheduled for a Cardioversion on Monday, December 30 with Dr. Gasper Lloyd.  Please arrive at the [facility] Entrance of [facility], [address], [place] [address] at 6:30 AM (This is 1 hour(s) prior to your procedure time).  Proceed to the Check-In Desk directly inside the entrance.  Procedure Parking: Use the entrance off of the [address] side of the hospital. Turn right upon entering and follow the driveway to parking that is directly in front of the Heart & Vascular Center. There is no valet parking available at this entrance, however there is an awning directly in front of the Heart & Vascular Center for drop off/ pick up for patients.   DIET:  Nothing to eat or drink after midnight except a sip of water with medications (see medication instructions below)  MEDICATION INSTRUCTIONS: !!IF ANY NEW MEDICATIONS ARE STARTED AFTER TODAY, PLEASE NOTIFY YOUR PROVIDER AS SOON AS POSSIBLE!!  FYI: Medications such as Semaglutide (Ozempic, [place]), Tirzepatide (Mounjaro, Zepbound), Dulaglutide (Trulicity), etc ("GLP1 agonists") AND Canagliflozin (Invokana), Dapagliflozin (Farxiga), Empagliflozin  (Jardiance), Ertugliflozin (Steglatro), Bexagliflozin [employer]) or any combination with one of these drugs such as Invokamet (Canagliflozin/Metformin), Synjardy (Empagliflozin/Metformin), etc ("SGLT2 inhibitors") must be held around the time of a procedure. This is not a comprehensive list of all of these drugs. Please review all of your medications and talk to your provider if you take any one of these. If you are not sure, ask your provider.   DO NOT TAKE LASIX THE MORNING OF PROCEDURE   DO NOT TAKE FARXIGA 3 DAYS PRIOR TO PROCEDURE   DO NOT TAKE SPIRONOLACTONE THE MORNING OF PROCEDURE   Continue taking your anticoagulant (blood thinner): Apixaban (Eliquis).  You will need to continue this after your procedure until you are told by your provider that it is safe to stop.    FYI:  For your safety, and to allow [place] to monitor your vital signs accurately during the surgery/procedure we request: If you have artificial nails, gel coating, SNS etc, please have those removed prior to your surgery/procedure. Not having the nail coverings /polish removed may result in cancellation or delay of your surgery/procedure.  Your support person will be asked to wait in the waiting room during your procedure.  It is OK to have someone drop you off and come back when you are ready to be discharged.  You cannot drive after the procedure and will need someone to drive you home.  Bring your insurance cards.  *Special Note: Every effort is made to have your procedure done on time. Occasionally there are emergencies that occur at the hospital that may cause delays. Please be patient if a delay does occur.  Follow-Up in: 2 WEEKS AS SCHEDULED WITH PHARMACY   THEN AGAIN IN 1 MONTH WITH DR. Gasper Lloyd   If you have any questions or concerns before your next appointment please send [place] a message through [place] or call our office at [phone] Monday-Friday 8 am-5 pm.   If you have an urgent need after hours on the  weekend please call your Primary Cardiologist or the Advanced [facility] Failure Clinic in [place] at [phone].   At the [facility] Clinic, you and your health needs are our priority. We have a designated team specialized in the treatment of Heart Failure. This Care Team includes your primary Heart Failure Specialized Cardiologist (physician), Advanced Practice Providers (APPs- Physician Assistants and Nurse Practitioners), and Pharmacist who all work together to provide you with the care you need, when you need it.   You may see any of the following providers on your designated Care Team at your next follow up:  Dr. Arvilla Meres Dr. Marca Ancona Dr. Dorthula Nettles Dr. Theresia Bough Tonye Becket, NP Robbie Lis, [place] [address] [place], [place] Brynda Peon, NP [place], NP Clarisa Kindred, NP Enos Fling, PharmD
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
## 2023-07-13 ENCOUNTER — Encounter
Admission: RE | Disposition: A | Discharge status: Home / Self Care | Payer: Self-pay | Source: Home / Self Care | Attending: Cardiology

## 2023-07-13 ENCOUNTER — Ambulatory Visit: Payer: 59 | Admitting: Certified Registered Nurse Anesthetist

## 2023-07-13 ENCOUNTER — Other Ambulatory Visit: Payer: Self-pay

## 2023-07-13 ENCOUNTER — Ambulatory Visit
Admission: RE | Admit: 2023-07-13 | Discharge: 2023-07-13 | Disposition: A | Discharge status: Home / Self Care | Payer: 59 | Attending: Cardiology | Admitting: Cardiology

## 2023-07-13 ENCOUNTER — Encounter: Payer: Self-pay | Admitting: Cardiology

## 2023-07-13 ENCOUNTER — Encounter: Payer: 59 | Admitting: Family

## 2023-07-13 DIAGNOSIS — I4892 Unspecified atrial flutter: Secondary | ICD-10-CM | POA: Insufficient documentation

## 2023-07-13 DIAGNOSIS — I4891 Unspecified atrial fibrillation: Secondary | ICD-10-CM | POA: Diagnosis not present

## 2023-07-13 DIAGNOSIS — Z7901 Long term (current) use of anticoagulants: Secondary | ICD-10-CM | POA: Diagnosis not present

## 2023-07-13 DIAGNOSIS — I5020 Unspecified systolic (congestive) heart failure: Secondary | ICD-10-CM | POA: Diagnosis not present

## 2023-07-13 DIAGNOSIS — Z6834 Body mass index (BMI) 34.0-34.9, adult: Secondary | ICD-10-CM | POA: Diagnosis not present

## 2023-07-13 DIAGNOSIS — I110 Hypertensive heart disease with heart failure: Secondary | ICD-10-CM | POA: Insufficient documentation

## 2023-07-13 DIAGNOSIS — K219 Gastro-esophageal reflux disease without esophagitis: Secondary | ICD-10-CM | POA: Insufficient documentation

## 2023-07-13 HISTORY — PX: CARDIOVERSION: SHX1299

## 2023-07-13 SURGERY — CARDIOVERSION
Anesthesia: General

## 2023-07-13 MED ORDER — METOPROLOL SUCCINATE ER 50 MG PO TB24: 50.0000 mg | ORAL_TABLET | Freq: Every day | ORAL | 3 refills | Status: DC

## 2023-07-13 MED ORDER — SODIUM CHLORIDE 0.9 % IV SOLN: INTRAVENOUS | Status: DC

## 2023-07-13 MED ORDER — PROPOFOL 10 MG/ML IV BOLUS
INTRAVENOUS | Status: AC
  Filled 2023-07-13: qty 20

## 2023-07-13 MED ORDER — PROPOFOL 10 MG/ML IV BOLUS
INTRAVENOUS | Status: DC | PRN
  Administered 2023-07-13: 100 mg via INTRAVENOUS

## 2023-07-13 NOTE — Anesthesia Postprocedure Evaluation (Signed)
Anesthesia Post Note  Patient: Ernest Boyd  Procedure(s) Performed: CARDIOVERSION  Patient location during evaluation: [facility] Recovery Anesthesia Type: General Level of consciousness: awake and alert Pain management: pain level controlled Vital Signs Assessment: post-procedure vital signs reviewed and stable Respiratory status: spontaneous breathing, nonlabored ventilation, respiratory function stable and patient connected to nasal cannula oxygen Cardiovascular status: blood pressure returned to baseline and stable Postop Assessment: no apparent nausea or vomiting Anesthetic complications: no   No notable events documented.   Last Vitals:  Vitals:   07/13/23 0815 07/13/23 0830  BP: 107/87 108/85  Pulse: (!) 47 (!) 50  Resp: 17 18  Temp:    SpO2: 97% 95%    Last Pain:  Vitals:   07/13/23 0700  TempSrc: Oral  PainSc: 0-No pain                 Louie Boston

## 2023-07-13 NOTE — Interval H&P Note (Signed)
History and Physical Interval Note:  07/13/2023 7:42 AM  Ernest Boyd  has presented today for surgery, with the diagnosis of Cardioversion  Afib.  The various methods of treatment have been discussed with the patient and family. After consideration of risks, benefits and other options for treatment, the patient has consented to  Procedure(s): CARDIOVERSION (N/A) as a surgical intervention.  The patient's history has been reviewed, patient examined, no change in status, stable for surgery.  I have reviewed the patient's chart and labs.  Questions were answered to the patient's satisfaction.     Ernest Boyd
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
## 2023-07-13 NOTE — Anesthesia Procedure Notes (Signed)
Date/Time: 07/13/2023 7:32 AM  Performed by: Ginger Carne, CRNAPre-anesthesia Checklist: Patient identified, Emergency Drugs available, Suction available, Patient being monitored and Timeout performed Patient Re-evaluated:Patient Re-evaluated prior to induction Oxygen Delivery Method: Nasal cannula Preoxygenation: Pre-oxygenation with 100% oxygen Induction Type: IV induction

## 2023-07-13 NOTE — Transfer of Care (Signed)
Immediate Anesthesia Transfer of Care Note  Patient: Ernest Boyd  Procedure(s) Performed: CARDIOVERSION  Patient Location:  [facility]ials recovery  Anesthesia Type:General  Level of Consciousness: awake, alert , and oriented  Airway & Oxygen Therapy: Patient Spontanous Breathing and Patient connected to nasal cannula oxygen  Post-op Assessment: Report given to RN and Post -op Vital signs reviewed and stable  Post vital signs: Reviewed and stable  Last Vitals:  Vitals Value Taken Time  BP 115/79 07/13/23 0744  Temp    Pulse 51 07/13/23 0744  Resp 17 07/13/23 0744  SpO2 99 % 07/13/23 0744  Vitals shown include unfiled device data.  Last Pain:  Vitals:   07/13/23 0700  TempSrc: Oral  PainSc: 0-No pain         Complications: No notable events documented.

## 2023-07-13 NOTE — CV Procedure (Signed)
    DIRECT CURRENT CARDIOVERSION  NAME:  Ernest Boyd    MRN: [id number] DOB:  1971/05/01    ADMIT DATE: 07/13/2023  INDICATIONS: Symptomatic atrial flutter Patient took >51m of oral anticoagulation without interruption.   CARDIOVERSION:     Indications:  Symptomatic Atrial Flutter  Procedure Details: The patient had the defibrillator pads placed in the anterior and posterior position. Once an appropriate level of sedation was confirmed, the patient was cardioverted x 1 with 200J of biphasic synchronized energy.  The patient converted to NSR.  There were no apparent complications.  The patient had normal neuro status and respiratory status post procedure with vitals stable as recorded elsewhere.  Adequate airway was maintained throughout and vital signs monitored per protocol.  [facility] [place] Advanced Heart Failure 7:43 AM

## 2023-07-14 ENCOUNTER — Encounter: Payer: Self-pay | Admitting: Cardiology

## 2023-07-16 ENCOUNTER — Emergency Department (HOSPITAL_COMMUNITY)
Admission: EM | Admit: 2023-07-16 | Discharge: 2023-07-16 | Disposition: A | Discharge status: Home / Self Care | Payer: 59 | Attending: Emergency Medicine | Admitting: Emergency Medicine

## 2023-07-16 ENCOUNTER — Emergency Department (HOSPITAL_COMMUNITY): Payer: 59

## 2023-07-16 ENCOUNTER — Other Ambulatory Visit: Payer: Self-pay

## 2023-07-16 DIAGNOSIS — Z79899 Other long term (current) drug therapy: Secondary | ICD-10-CM | POA: Diagnosis not present

## 2023-07-16 DIAGNOSIS — R5383 Other fatigue: Secondary | ICD-10-CM | POA: Insufficient documentation

## 2023-07-16 DIAGNOSIS — I10 Essential (primary) hypertension: Secondary | ICD-10-CM | POA: Insufficient documentation

## 2023-07-16 DIAGNOSIS — Z7901 Long term (current) use of anticoagulants: Secondary | ICD-10-CM | POA: Diagnosis not present

## 2023-07-16 DIAGNOSIS — R001 Bradycardia, unspecified: Secondary | ICD-10-CM | POA: Insufficient documentation

## 2023-07-16 DIAGNOSIS — R079 Chest pain, unspecified: Secondary | ICD-10-CM | POA: Diagnosis present

## 2023-07-16 LAB — BASIC METABOLIC PANEL
Anion gap: 11 (ref 5–15)
BUN: 13 mg/dL (ref 6–20)
CO2: 21 mmol/L — ABNORMAL LOW (ref 22–32)
Calcium: 9.5 mg/dL (ref 8.9–10.3)
Chloride: 101 mmol/L (ref 98–111)
Creatinine, Ser: 1.12 mg/dL (ref 0.61–1.24)
GFR, Estimated: >60 mL/min (ref >60)
Glucose, Bld: 110 mg/dL — ABNORMAL HIGH (ref 70–99)
Potassium: 4.7 mmol/L (ref 3.5–5.1)
Sodium: 133 mmol/L — ABNORMAL LOW (ref 135–145)

## 2023-07-16 LAB — CBC
HCT: 52.1 % — ABNORMAL HIGH (ref 39.0–52.0)
Hemoglobin: 17.6 g/dL — ABNORMAL HIGH (ref 13.0–17.0)
MCH: 28.6 pg (ref 26.0–34.0)
MCHC: 33.8 g/dL (ref 30.0–36.0)
MCV: 84.6 fL (ref 80.0–100.0)
Platelets: 234 10*3/uL (ref 150–400)
RBC: 6.16 MIL/uL — ABNORMAL HIGH (ref 4.22–5.81)
RDW: 14.5 % (ref 11.5–15.5)
WBC: 6.8 10*3/uL (ref 4.0–10.5)
nRBC: 0 % (ref 0.0–0.2)

## 2023-07-16 LAB — TROPONIN I (HIGH SENSITIVITY)
Troponin I (High Sensitivity): 6 ng/L (ref <18)
Troponin I (High Sensitivity): 7 ng/L (ref <18)

## 2023-07-16 NOTE — Discharge Instructions (Addendum)
 The tests today in the ED were reassuring.  Continue your blood pressure medications but I would hold the metoprolol until you speak with your cardiologist due to your persistent low heart rate.

## 2023-07-16 NOTE — ED Provider Notes (Signed)
 [facility] Provider Note   CSN: [id number] Arrival date & time: 07/16/23  1042     History  Chief Complaint  Patient presents with   Chest Pain   Hypertension    Ernest Boyd is a 53 y.o. male.   Chest Pain Hypertension Associated symptoms include chest pain.     Pt recently had a cardioversion.  He has been feeling fatigued since then.  He changed his medications recently and noticed his blood pressure was very elevated, 200/100.  He checked it a few minutes later and it was 170/100.  It gradually came down.  When he woke up this am he noticed it was elevated.  Up to 170s systolic.  Home Medications Prior to Admission medications   Medication Sig Start Date End Date Taking? Authorizing Provider  acetaminophen  (TYLENOL ) 500 MG tablet Take 500 mg by mouth every 6 (six) hours as needed for moderate pain (pain score 4-6).   Yes [provider]  albuterol  (VENTOLIN  HFA) 108 (90 Base) MCG/ACT inhaler Inhale 1-2 puffs into the lungs every 4 (four) hours as needed for wheezing or shortness of breath. 01/27/23  Yes Van Knee, MD  amiodarone  (PACERONE ) 200 MG tablet START: AMIODARONE  400MG  TWICE DAILY FOR 7 DAYS THEN REDUCE TO 200MG  TWICE DAILY FOR 7 DAYS  THEN REDUCE TO 200MG  ONCE DAILY Patient taking differently: Take 400 mg by mouth 2 (two) times daily. START: AMIODARONE  400MG  TWICE DAILY FOR 7 DAYS THEN REDUCE TO 200MG  TWICE DAILY FOR 7 DAYS  THEN REDUCE TO 200MG  ONCE DAILY 06/29/23  Yes Donette [place] A, FNP  apixaban  (ELIQUIS ) 5 MG TABS tablet Take 1 tablet (5 mg total) by mouth 2 (two) times daily. 06/25/23  Yes Dunn, Bernardino HERO, PA-C  calcium carbonate (TUMS - DOSED IN MG ELEMENTAL CALCIUM) 500 MG chewable tablet Chew 1 tablet by mouth as needed for indigestion or heartburn.   Yes [provider]  dapagliflozin  propanediol (FARXIGA ) 10 MG TABS tablet Take 1 tablet (10 mg total) by mouth daily. 05/26/23  Yes Odell Celinda Balo, MD  Evolocumab  (REPATHA ) 140 MG/ML SOSY Inject one injection sq every two weeks 10/24/22  Yes Dugal, Tabitha, FNP  furosemide  (LASIX ) 20 MG tablet Take 1 tablet (20 mg total) by mouth daily as needed for fluid or edema. 07/02/23 09/30/23 Yes Sabharwal, Aditya, DO  metoprolol  succinate (TOPROL  XL) 50 MG 24 hr tablet Take 1 tablet (50 mg total) by mouth daily. Take with or immediately following a meal. 07/13/23  Yes Sabharwal, Aditya, DO  sacubitril -valsartan  (ENTRESTO ) 49-51 MG Take 1 tablet by mouth 2 (two) times daily. 07/02/23  Yes Sabharwal, Aditya, DO  spironolactone  (ALDACTONE ) 25 MG tablet Take 1 tablet (25 mg total) by mouth daily. Patient taking differently: Take 25 mg by mouth every evening. 06/25/23  Yes Dunn, Bernardino HERO, PA-C      Allergies    Dilantin [phenytoin], Morphine and codeine, Nexlizet [bempedoic acid-ezetimibe], Simvastatin, and Vicodin hp [hydrocodone-acetaminophen ]    Review of Systems   Review of Systems  Cardiovascular:  Positive for chest pain.    Physical Exam Updated Vital Signs BP 117/78   Pulse (!) 45   Temp 97.8 F (36.6 C)   Resp 11   SpO2 96%  Physical Exam Vitals and nursing note reviewed.  Constitutional:      General: He is not in acute distress.    Appearance: He is well-developed.  HENT:     Head: Normocephalic and  atraumatic.     Right Ear: External ear normal.     Left Ear: External ear normal.  Eyes:     General: No scleral icterus.       Right eye: No discharge.        Left eye: No discharge.     Conjunctiva/sclera: Conjunctivae normal.  Neck:     Trachea: No tracheal deviation.  Cardiovascular:     Rate and Rhythm: Regular rhythm. Bradycardia present.  Pulmonary:     Effort: Pulmonary effort is normal. No respiratory distress.     Breath sounds: Normal breath sounds. No stridor. No wheezing or rales.  Abdominal:     General: Bowel sounds are normal. There is no distension.     Palpations: Abdomen is soft.     Tenderness:  There is no abdominal tenderness. There is no guarding or rebound.  Musculoskeletal:        General: No tenderness or deformity.     Cervical back: Neck supple.  Skin:    General: Skin is warm and dry.     Findings: No rash.  Neurological:     General: No focal deficit present.     Mental Status: He is alert.     Cranial Nerves: No cranial nerve deficit, dysarthria or facial asymmetry.     Sensory: No sensory deficit.     Motor: No abnormal muscle tone or seizure activity.     Coordination: Coordination normal.  Psychiatric:        Mood and Affect: Mood normal.     ED Results / Procedures / Treatments   Labs (all labs ordered are listed, but only abnormal results are displayed) Labs Reviewed  BASIC METABOLIC PANEL - Abnormal; Notable for the following components:      Result Value   Sodium 133 (*)    CO2 21 (*)    Glucose, Bld 110 (*)    All other components within normal limits  CBC - Abnormal; Notable for the following components:   RBC 6.16 (*)    Hemoglobin 17.6 (*)    HCT 52.1 (*)    All other components within normal limits  TROPONIN I (HIGH SENSITIVITY)  TROPONIN I (HIGH SENSITIVITY)    EKG EKG Interpretation Date/Time:  Thursday July 16 2023 11:06:06 EST Ventricular Rate:  46 PR Interval:  194 QRS Duration:  84 QT Interval:  496 QTC Calculation: 434 R Axis:   33  Text Interpretation: Sinus bradycardia Otherwise normal ECG When compared with ECG of 13-Jul-2023 07:37, No significant change since last tracing Confirmed by Randol Simmonds [phone]) on 07/16/2023 2:51:09 PM  Radiology DG Chest 2 View Result Date: 07/16/2023 CLINICAL DATA:  Chest pain. EXAM: CHEST - 2 VIEW COMPARISON:  05/20/2023. FINDINGS: Bilateral lung fields are clear. Bilateral costophrenic angles are clear. Normal cardio-mediastinal silhouette. No acute osseous abnormalities. The soft tissues are within normal limits. IMPRESSION: *No active cardiopulmonary disease. Electronically Signed   By:  Ree Molt M.D.   On: 07/16/2023 14:02    Procedures Procedures    Medications Ordered in ED Medications - No data to display  ED Course/ Medical Decision Making/ A&P                                 Medical Decision Making Problems Addressed: Bradycardia: undiagnosed new problem with uncertain prognosis Chest pain, unspecified type: acute illness or injury that poses a threat to life or bodily  functions  Amount and/or Complexity of Data Reviewed Labs: ordered. Decision-making details documented in ED Course. Radiology: ordered and independent interpretation performed.   Patient presented to the ED for evaluation of chest discomfort hypertension after recent cardioversion.  Patient has also noticed that his heart rate has been lower than normal.  This is new and he has not experienced that with prior cardioversions.  Patient did have his beta-blocker regimen changed recently.  ED workup reassuring.  No signs of cardiac ischemia.  Patient's blood pressure has remained normal in the ED.  He however has remained persistently bradycardic.  Sinus bradycardia.  This could be contributing to some of his fatigue.  I will have him stop his metoprolol  temporarily until he can speak with his cardiologist.  Evaluation and diagnostic testing in the emergency department does not suggest an emergent condition requiring admission or immediate intervention beyond what has been performed at this time.  The patient is safe for discharge and has been instructed to return immediately for worsening symptoms, change in symptoms or any other concerns.        Final Clinical Impression(s) / ED Diagnoses Final diagnoses:  Chest pain, unspecified type  Bradycardia    Rx / DC Orders ED Discharge Orders     None         Randol Simmonds, MD 07/16/23 [phone]

## 2023-07-16 NOTE — ED Provider Triage Note (Signed)
 Emergency Medicine Provider Triage Evaluation Note  RAJON BISIG , a 53 y.o. male  was evaluated in triage.  Pt complains of elevated blood pressure since change of metoprolol .  Cardioversion this past Monday.  Endorses chest tightness, shortness of breath with exertion, dizziness that is unchanged over the past month.  Review of Systems  Positive: Negative:   Physical Exam  BP (!) 135/96 (BP Location: Right Arm)   Pulse (!) 46   Temp 97.8 F (36.6 C)   Resp 18   SpO2 100%  Gen:   Awake, no distress   Resp:  Normal effort  MSK:   Moves extremities without difficulty  Other:    Medical Decision Making  Medically screening exam initiated at 12:18 PM.  Appropriate orders placed.  [name] was informed that the remainder of the evaluation will be completed by another provider, this initial triage assessment does not replace that evaluation, and the importance of remaining in the ED until their evaluation is complete.     Donnajean Lynwood DEL, PA-C 07/16/23 1228

## 2023-07-16 NOTE — ED Triage Notes (Signed)
 Pt states heart medication was changed yesterday.

## 2023-07-16 NOTE — ED Triage Notes (Signed)
 Pt states he was cardioverted Monday at [facility], was dizzy, lightheaded and high blood pressure. Woke up this morning and blood pressure was still high 212/130 then went down 167/90. Chest pain and SOB with exertion.

## 2023-07-20 NOTE — Progress Notes (Deleted)
 Advanced Heart Failure Clinic Note  PCP: Corwin Antu, FNP PCP-Cardiologist: Deatrice Cage, MD HF-Cardiologist: Ria Commander, DO  HPI:  Ernest Boyd is a 53 y.o. male with hypertension, hyperlipidemia, HTN, morbid obesity and recently diagnosed heart failure with reduced ejection fraction and atrial fibrillation.   On May 20, 2023 [name] was seen by his PCP for complaints of congestion/shortness of breath.  At that time he was noted to be in atrial fibrillation with RVR and was subsequently transferred to [facility].  Echocardiogram there with a EF of 55%.  He was started on apixaban .  TEE with early forming spontaneous echo contrast leading to cancel the DCCV.  He was started on amiodarone  for A-fib with RVR and brought back 3 weeks later for outpatient cardioversion. He unfortunately failed outpatient DCCV x 2. TEE during DCCV on 06/19/23 showed LVEF of 35-40% with moderately reduced RV failure, mild-moderate MR, and no LAA thrombus.   Seen by Dr. Commander on 07/02/23. Mr. Soffer reported feeling fatigued and dyspneic after walking >30-23ft.  Losartan  was changed to Entresto  49/51 mg BID and furosemide  prn was added. Also instructed to take amiodarone  400 mg BID for 10 days.   Underwent DCCV again on 07/13/23 after amiodarone  load. Metoprolol  tartrate 100 mg BID was changed to metoprolol  succinate 50 mg daily.  Presented to the ED 07/16/23 due to chest discomfort and dyspnea on exertion. Found to be bradycardic and was told to hold metoprolol  until cardiology follow-up.   Today [name] returns to Heart Failure Clinic for pharmacist medication titration. Reports feeling ***. {Reports/Denies:210917258} {ACTIONS;DENIES/REPORTS:21021675::Denies} being able to complete all activities of daily living (ADLs). Is *** active throughout the day. Weight at home is *** pounds. Takes {CHL AMB AHFC Medications:210917260} ***. Appetite ***. {Does Follow/Does Not Follow:210917261} a low sodium  diet.  Current Heart Failure Medications: Loop diuretic: furosemide  20 mg prn Beta-Blocker: metoprolol  ER 50 mg daily being held ACEI/ARB/ARNI: Entresto  49/51 mg BID MRA: spironolactone  25 mg daily SGLT2i: Farxiga  10 mg daily  Has the patient been experiencing any side effects to the medications prescribed? {yes/no:20286}  Does the patient have any problems obtaining medications due to transportation or finances? {yes/no:20286}  Understanding of regimen: {CHL AMB AHFC Excellent/Good/Fair/Poor:210917262}  Understanding of indications: {CHL AMB AHFC Excellent/Good/Fair/Poor:210917262}  Potential of adherence: {CHL AMB AHFC Excellent/Good/Fair/Poor:210917262}  Patient understands to avoid NSAIDs.  Patient understands to avoid decongestants.  Pertinent Lab Values: Creatinine, Ser  Date Value Ref Range Status  07/16/2023 1.12 0.61 - 1.24 mg/dL Final   BUN  Date Value Ref Range Status  07/16/2023 13 6 - 20 mg/dL Final  87/83/7975 11 6 - 24 mg/dL Final   Potassium  Date Value Ref Range Status  07/16/2023 4.7 3.5 - 5.1 mmol/L Final   Sodium  Date Value Ref Range Status  07/16/2023 133 (L) 135 - 145 mmol/L Final  06/29/2023 140 134 - 144 mmol/L Final   B Natriuretic Peptide  Date Value Ref Range Status  05/20/2023 249.2 (H) 0.0 - 100.0 pg/mL Final    Comment:    Performed at [facility], [address] [place], [place] [address]   TSH  Date Value Ref Range Status  05/20/2023 1.092 0.350 - 4.500 uIU/mL Final    Comment:    Performed by a 3rd Generation assay with a functional sensitivity of <=0.01 uIU/mL. Performed at [facility], [address], [place], [place] [address]     Vital Signs: There were no vitals filed  for this visit.  Assessment/Plan: Heart failure with reduced EF Etiology of YQ:opxzob secondary to afib with RVR. MD planning LHC/RHC or coronary CTA if LVEF does not improve with conversion to normal sinus rhythm NYHA class / AHA  Stage:IIB Volume status & Diuretics: Euvolemic, lasix  20mg  PRN only Vasodilators:Entresto  49/51mg  BID, start today. D/C losartan .  Beta-Blocker:metoprolol  tartrate 100mg  BID; will switch to succinate at follow up once in NSR.  MRA:spironolactone  25mg  daily Cardiometabolic:start farxiga  10mg  daily Devices therapies & Valvulopathies:not currently indicated  Advanced therapies:not currently indicated.    2. Atrial fibrillation/flutter with RVR - He has now failed x2 DCCV - Start amio load 400mg  BID - I am concerned about him remaining in afib for a prolonged duration in the setting of HFrEF. At this time he is well compensated but will benefit greatly from maintenance of NSR. I've explained alarm symptoms to him and asked him to come to the ER with any signs of worsening HFrEF or increasing fatigue.  - Consult EP for ablation. I think he would make a good ablation candidate; LA is dilated on TEE.  - continue toprol  100   3. HTN - D/C losartan  - start Entresto  49/51mg  BID - reviewed BMP on 06/29/23; sCr within normal limits.    4. OSA - Likey has OSA - We will plan on sleep study at follow up    5. Obesity - Discussed importance of nutrition and weight loss.   Follow up: Dr. Gardenia in 2 weeks  ***

## 2023-07-21 ENCOUNTER — Other Ambulatory Visit: Payer: Self-pay | Admitting: Physician Assistant

## 2023-07-21 ENCOUNTER — Institutional Professional Consult (permissible substitution): Payer: 59 | Admitting: Cardiology

## 2023-07-22 ENCOUNTER — Other Ambulatory Visit: Payer: 59

## 2023-07-23 ENCOUNTER — Other Ambulatory Visit (HOSPITAL_COMMUNITY): Payer: Self-pay

## 2023-07-23 ENCOUNTER — Ambulatory Visit: Payer: 59 | Attending: Cardiology | Admitting: Pharmacist

## 2023-07-23 ENCOUNTER — Telehealth (HOSPITAL_COMMUNITY): Payer: Self-pay

## 2023-07-23 VITALS — BP 128/84 | HR 55 | Wt 251.8 lb

## 2023-07-23 DIAGNOSIS — I5022 Chronic systolic (congestive) heart failure: Secondary | ICD-10-CM | POA: Diagnosis present

## 2023-07-23 MED ORDER — METOPROLOL SUCCINATE ER 25 MG PO TB24: 25.0000 mg | ORAL_TABLET | Freq: Every day | ORAL | 3 refills | Status: DC

## 2023-07-23 MED ORDER — SACUBITRIL-VALSARTAN 97-103 MG PO TABS: 1.0000 | ORAL_TABLET | Freq: Two times a day (BID) | ORAL | 11 refills | Status: DC

## 2023-07-23 NOTE — Telephone Encounter (Signed)
 Pharmacy Patient Advocate Encounter  Insurance verification completed.    The patient is insured through [employer]. Patient has [employer], may use a copay card, and/or apply for patient assistance if available.    Ran test claim for Jardiance  10mg  is a prior authorization.  Ran test claim for Farxiga  5mg  and the current 30 day co-pay is $45.  Ran test claim for Entresto  49/51mg  and the current 30 day co-pay is $45.   This test claim was processed through [employer]- copay amounts may vary at other pharmacies due to [employer], or as the patient moves through the different stages of their insurance plan.

## 2023-07-23 NOTE — Patient Instructions (Addendum)
 It was a pleasure seeing you today!  MEDICATIONS: -We are changing your medications today -Increase Entresto  to 97/103 mg twice daily. You can take two tablets of your current 49/51 mg dose twice daily until you run out. -Decrease metoprolol  succinate to 25 mg once daily. You can take half a tablet of your 50 mg metoprolol  until you run out. You will change to one tablet of the new prescription sent today. -Call if you have questions about your medications.  LABS: -We will call you if your labs need attention.  NEXT APPOINTMENT: Return to clinic on 07/29/23 with Dr. Gardenia.  In general, to take care of your heart failure: -Limit your fluid intake to 2 Liters (half-gallon) per day.   -Limit your salt intake to ideally 2-3 grams (2000-3000 mg) per day. -Weigh yourself daily and record, and bring that weight diary to your next appointment.  (Weight gain of 2-3 pounds in 1 day typically means fluid weight.) -The medications for your heart are to help your heart and help you live longer.   -Please contact us  before stopping any of your heart medications.  Call the clinic at [phone] with questions or to reschedule future appointments.

## 2023-07-23 NOTE — Progress Notes (Signed)
 Advanced Heart Failure Clinic Note  PCP: Corwin Antu, FNP PCP-Cardiologist: Deatrice Cage, MD HF-Cardiologist: Ria Commander, DO  HPI:  Ernest Boyd is a 53 y.o. male with hypertension, hyperlipidemia, HTN, morbid obesity and recently diagnosed heart failure with reduced ejection fraction and atrial fibrillation.   On May 20, 2023 [name] was seen by his PCP for complaints of congestion/shortness of breath.  At that time he was noted to be in atrial fibrillation with RVR and was subsequently transferred to [facility].  Echocardiogram there with a EF of 55%.  He was started on apixaban .  TEE with early forming spontaneous echo contrast leading to cancel the DCCV.  He was started on amiodarone  for A-fib with RVR and brought back 3 weeks later for outpatient cardioversion. He unfortunately failed outpatient DCCV x 2. TEE during DCCV on 06/19/23 showed LVEF of 35-40% with moderately reduced RV function, mild-moderate MR, and no LAA thrombus.   Seen by Dr. Commander on 07/02/23. Mr. Summer reported feeling fatigued and dyspneic after walking >30-52ft.  Losartan  was changed to Entresto  49/51 mg BID and furosemide  prn was added. Also instructed to take amiodarone  400 mg BID for 10 days.   Underwent DCCV again on 07/13/23 after amiodarone  load. Metoprolol  tartrate 100 mg BID was changed to metoprolol  succinate 50 mg daily.  Presented to the ED 07/16/23 due to hypertension dyspnea on exertion. Found to be bradycardic and was told to hold metoprolol  until cardiology follow-up.   Today Ernest Boyd returns to Heart Failure Clinic for pharmacist medication titration. Reports feeling good overall with some days better than others. Has had persistent mild fatigue and shortness of breath for quite some time. Reports shortness of breath with moderate exertion, but not with ADLs. Reports feeling the best he's felt in a long time this past weekend. Denies dizziness, lightheadedness, chest pain, palpitations,  Damyn, orthopnea, orthostasis, PND. Reports being able to complete all activities of daily living (ADLs). Is active throughout the day. Weight at home is 245 pounds. Takes furosemide  20 mg prn. Has not taken any recently Appetite is good. Somewhat follows a low sodium diet, but did have chinese food last night. BP at home is generally 120s/80s. He has had two BP readings with SBP of 99 and two where BP was >170. He attributes BP variability to his metoprolol . He restarted metoprolol  50 mg daily after ED discharge because he was not aware he was instructed to hold.  Current Heart Failure Medications: Loop diuretic: furosemide  20 mg prn Beta-Blocker: metoprolol  ER 50 mg daily  ACEI/ARB/ARNI: Entresto  49/51 mg BID MRA: spironolactone  25 mg daily SGLT2i: Farxiga  10 mg daily  Has the patient been experiencing any side effects to the medications prescribed? No  Does the patient have any problems obtaining medications due to transportation or finances? No  Understanding of regimen: Excellent  Understanding of indications: Excellent  Potential of adherence: Excellent  Patient understands to avoid NSAIDs.  Patient understands to avoid decongestants.  Pertinent Lab Values: Creatinine, Ser  Date Value Ref Range Status  07/16/2023 1.12 0.61 - 1.24 mg/dL Final   BUN  Date Value Ref Range Status  07/16/2023 13 6 - 20 mg/dL Final  87/83/7975 11 6 - 24 mg/dL Final   Potassium  Date Value Ref Range Status  07/16/2023 4.7 3.5 - 5.1 mmol/L Final   Sodium  Date Value Ref Range Status  07/16/2023 133 (L) 135 - 145 mmol/L Final  06/29/2023 140 134 - 144 mmol/L Final   B  Natriuretic Peptide  Date Value Ref Range Status  05/20/2023 249.2 (H) 0.0 - 100.0 pg/mL Final    Comment:    Performed at [facility], [address], [place], [place] [address]   TSH  Date Value Ref Range Status  05/20/2023 1.092 0.350 - 4.500 uIU/mL Final    Comment:    Performed by a 3rd Generation assay with a  functional sensitivity of <=0.01 uIU/mL. Performed at [facility], [address], [place], [place] [address]     Vital Signs: Today's Vitals   07/23/23 1513  BP: (!) 148/86  Pulse: (!) 55  SpO2: 96%  Weight: 251 lb 12.8 oz (114.2 kg)    Assessment/Plan: Heart failure with reduced EF Etiology of YQ:opxzob secondary to afib with RVR. MD planning LHC/RHC or coronary CTA if LVEF does not improve with conversion to normal sinus rhythm NYHA class / AHA Stage:IIB Volume status & Diuretics: Euvolemic, lasix  20mg  PRN only. Weight is up some today. This may be due to winter clothes. Entresto  increase will help augment diuresis soon. Re-educated on prn furosemide . Vasodilators: Increase Entresto  to 97/103 mg BID. BP elevated in clinic today and stable at home. BMET at visit on 07/29/23. Did not see fill history, but patient reports receiving free trial.  Beta-Blocker: Decrease metoprolol  succinate to 25 mg daily. Patient never held metoprolol  as instructed. Has not been symptomatic. Reports bradycardia was an incidental finding and he presented to the ED due to SBP >200 at home. HR on EKG today was 51 bpm. MRA: Continue spironolactone  25mg  daily Cardiometabolic: Continue Farxiga  10mg  daily Devices therapies & Valvulopathies:not currently indicated  Advanced therapies:not currently indicated.    2. Atrial fibrillation/flutter with RVR - Failed x2 DCCV, then loaded with amiodarone  and underwent successful DCCV. - Sinus bradycardia today on EKG - MD consulted EP for ablation.  - Decrease Toprol  to 25 mg daily.   3. HTN - Increase Entresto  to 97-103 mg BID - Reviewed BMP on 07/16/23; sCr within normal limits.    4. OSA - Likey has OSA - We will plan on sleep study at follow up    5. Obesity - Discussed importance of nutrition and weight loss.   Follow up: Dr. Gardenia in 2 weeks  Please do not hesitate to reach out with questions or concerns,  Jaun Bash, PharmD, CPP,  BCPS Heart Failure Pharmacist  Phone - [phone] 07/23/2023 4:02 PM

## 2023-07-24 ENCOUNTER — Ambulatory Visit: Payer: 59 | Attending: Cardiovascular Disease | Admitting: Cardiovascular Disease

## 2023-07-24 ENCOUNTER — Telehealth: Payer: Self-pay | Admitting: Cardiovascular Disease

## 2023-07-24 ENCOUNTER — Encounter: Payer: Self-pay | Admitting: Cardiovascular Disease

## 2023-07-24 VITALS — BP 116/87 | HR 84 | Ht 64.0 in | Wt 252.0 lb

## 2023-07-24 DIAGNOSIS — I5022 Chronic systolic (congestive) heart failure: Secondary | ICD-10-CM

## 2023-07-24 DIAGNOSIS — I10 Essential (primary) hypertension: Secondary | ICD-10-CM

## 2023-07-24 DIAGNOSIS — G4730 Sleep apnea, unspecified: Secondary | ICD-10-CM | POA: Diagnosis not present

## 2023-07-24 DIAGNOSIS — I4819 Other persistent atrial fibrillation: Secondary | ICD-10-CM

## 2023-07-24 NOTE — Patient Instructions (Signed)
 Medication Instructions:  No changes *If you need a refill on your cardiac medications before your next appointment, please call your pharmacy*   Lab Work: None ordered If you have labs (blood work) drawn today and your tests are completely normal, you will receive your results only by: MyChart Message (if you have MyChart) OR A paper copy in the mail If you have any lab test that is abnormal or we need to change your treatment, we will call you to review the results.   Follow-Up: At [facility], you and your health needs are our priority.  As part of our continuing mission to provide you with exceptional heart care, we have created designated Provider Care Teams.  These Care Teams include your primary Cardiologist (physician) and Advanced Practice Providers (APPs -  Physician Assistants and Nurse Practitioners) who all work together to provide you with the care you need, when you need it.  We recommend signing up for the patient portal called MyChart.  Sign up information is provided on this After Visit Summary.  MyChart is used to connect with patients for Virtual Visits (Telemedicine).  Patients are able to view lab/test results, encounter notes, upcoming appointments, etc.  Non-urgent messages can be sent to your provider as well.   To learn more about what you can do with MyChart, go to [email or web address].    Your next appointment:   6 month(s)  Provider:   You may see Deatrice Cage, MD or one of the following Advanced Practice Providers on your designated Care Team:   Lonni Meager, NP Bernardino Bring, PA-C Cadence Franchester, PA-C Tylene Lunch, NP Barnie Hila, NP    Other Instructions WatchPAT?  Is a FDA cleared portable home sleep study test that uses a watch and 3 points of contact to monitor 7 different channels, including your heart rate, oxygen saturations, body position, snoring, and chest motion.  The study is easy to use from the comfort of your own  home and accurately detect sleep apnea.  Before bed, you attach the chest sensor, attached the sleep apnea bracelet to your nondominant hand, and attach the finger probe.  After the study, the raw data is downloaded from the watch and scored for apnea events.   For more information: [email or web address]  Patient Testing Instructions:  Do not put battery into the device until bedtime when you are ready to begin the test. Please call the support number if you need assistance after following the instructions below: 24 hour support line- [phone] or [facility] support at [phone] (option 2)  Download the [facility] WatchPAT One app through the google play store or App Store  Be sure to turn on or enable access to bluetooth in settlings on your smartphone/ device  Make sure no other bluetooth devices are on and within the vicinity of your smartphone/ device and WatchPAT watch during testing.  Make sure to leave your smart phone/ device plugged in and charging all night.  When ready for bed:  Follow the instructions step by step in the WatchPAT One App to activate the testing device. For additional instructions, including video instruction, visit the WatchPAT One video on Youtube. You can search for WatchPat One within Youtube (video is 4 minutes and 18 seconds) or enter: https://youtube/watch?[email or web address]_vbiwxE Please note: You will be prompted to enter a Pin to connect via bluetooth when starting the test. The PIN will be assigned to you when you receive the test.  The device is disposable, but it recommended that you retain  the device until you receive a call letting you know the study has been received and the results have been interpreted.  We will let you know if the study did not transmit to us  properly after the test is completed. You do not need to call us  to confirm the receipt of the test.  Please complete the test within 48 hours of receiving PIN.   Frequently Asked Questions:   What is Watch Bruna one?  A single use fully disposable home sleep apnea testing device and will not need to be returned after completion.  What are the requirements to use WatchPAT one?  The be able to have a successful watchpat one sleep study, you should have your Watch pat one device, your smart phone, watch pat one app, your PIN number and Internet access What type of phone do I need?  You should have a smart phone that uses Android 5.1 and above or any Iphone with IOS 10 and above How can I download the WatchPAT one app?  Based on your device type search for WatchPAT one app either in google play for android devices or APP store for Iphone's Where will I get my PIN for the study?  Your PIN will be provided by your physician's office. It is used for authentication and if you lose/forget your PIN, please reach out to your providers office.  I do not have Internet at home. Can I do WatchPAT one study?  WatchPAT One needs Internet connection throughout the night to be able to transmit the sleep data. You can use your home/local internet or your cellular's data package. However, it is always recommended to use home/local Internet. It is estimated that between 20MB-30MB will be used with each study.However, the application will be looking for space in the phone to start the study.  What happens if I lose internet or bluetooth connection?  During the internet disconnection, your phone will not be able to transmit the sleep data. All the data, will be stored in your phone. As soon as the internet connection is back on, the phone will being sending the sleep data. During the bluetooth disconnection, WatchPAT one will not be able to to send the sleep data to your phone. Data will be kept in the WatchPAT one until two devices have bluetooth connection back on. As soon as the connection is back on, WatchPAT one will send the sleep data to the phone.  How long do I need to wear the WatchPAT one?  After  you start the study, you should wear the device at least 6 hours.  How far should I keep my phone from the device?  During the night, your phone should be within 15 feet.  What happens if I leave the room for restroom or other reasons?  Leaving the room for any reason will not cause any problem. As soon as your get back to the room, both devices will reconnect and will continue to send the sleep data. Can I use my phone during the sleep study?  Yes, you can use your phone as usual during the study. But it is recommended to put your watchpat one on when you are ready to go to bed.  How will I get my study results?  A soon as you completed your study, your sleep data will be sent to the provider. They will then share the results with you when they are ready.

## 2023-07-24 NOTE — Telephone Encounter (Signed)
 LVM to move appt up due to weather, please contact [facility] for reschedule
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
## 2023-07-27 ENCOUNTER — Other Ambulatory Visit: Payer: Self-pay | Admitting: Family

## 2023-07-27 MED ORDER — AMIODARONE HCL 200 MG PO TABS: 200.0000 mg | ORAL_TABLET | Freq: Every day | ORAL | 11 refills | Status: DC

## 2023-07-29 ENCOUNTER — Other Ambulatory Visit
Admission: RE | Admit: 2023-07-29 | Discharge: 2023-07-29 | Disposition: A | Discharge status: Home / Self Care | Payer: 59 | Source: Ambulatory Visit | Attending: Cardiology | Admitting: Cardiology

## 2023-07-29 ENCOUNTER — Ambulatory Visit: Payer: 59 | Admitting: Cardiology

## 2023-07-29 VITALS — BP 135/83 | HR 57 | Wt 252.0 lb

## 2023-07-29 DIAGNOSIS — I5022 Chronic systolic (congestive) heart failure: Secondary | ICD-10-CM | POA: Diagnosis present

## 2023-07-29 DIAGNOSIS — I4819 Other persistent atrial fibrillation: Secondary | ICD-10-CM | POA: Diagnosis not present

## 2023-07-29 LAB — COMPREHENSIVE METABOLIC PANEL
ALT: 28 U/L (ref 0–44)
AST: 27 U/L (ref 15–41)
Albumin: 4.3 g/dL (ref 3.5–5.0)
Alkaline Phosphatase: 40 U/L (ref 38–126)
Anion gap: 9 (ref 5–15)
BUN: 18 mg/dL (ref 6–20)
CO2: 22 mmol/L (ref 22–32)
Calcium: 9.4 mg/dL (ref 8.9–10.3)
Chloride: 103 mmol/L (ref 98–111)
Creatinine, Ser: 1.29 mg/dL — ABNORMAL HIGH (ref 0.61–1.24)
GFR, Estimated: >60 mL/min (ref >60)
Glucose, Bld: 116 mg/dL — ABNORMAL HIGH (ref 70–99)
Potassium: 4 mmol/L (ref 3.5–5.1)
Sodium: 134 mmol/L — ABNORMAL LOW (ref 135–145)
Total Bilirubin: 1.1 mg/dL (ref 0.0–1.2)
Total Protein: 8.1 g/dL (ref 6.5–8.1)

## 2023-07-29 LAB — T4, FREE: Free T4: 0.92 ng/dL (ref 0.61–1.12)

## 2023-07-29 LAB — TSH: TSH: 2.331 u[IU]/mL (ref 0.350–4.500)

## 2023-07-29 MED ORDER — SACUBITRIL-VALSARTAN 97-103 MG PO TABS: 1.0000 | ORAL_TABLET | Freq: Two times a day (BID) | ORAL | 11 refills | Status: AC

## 2023-07-29 MED ORDER — APIXABAN 5 MG PO TABS: 5.0000 mg | ORAL_TABLET | Freq: Two times a day (BID) | ORAL | 11 refills | Status: AC

## 2023-07-29 MED ORDER — DAPAGLIFLOZIN PROPANEDIOL 10 MG PO TABS: 10.0000 mg | ORAL_TABLET | Freq: Every day | ORAL | 11 refills | Status: AC

## 2023-07-29 MED ORDER — METOPROLOL SUCCINATE ER 25 MG PO TB24: 25.0000 mg | ORAL_TABLET | Freq: Every day | ORAL | 11 refills | Status: DC

## 2023-07-29 MED ORDER — SPIRONOLACTONE 25 MG PO TABS: 25.0000 mg | ORAL_TABLET | Freq: Every day | ORAL | 11 refills | Status: AC
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
## 2023-07-29 NOTE — Patient Instructions (Addendum)
 Go over to the [facility]. Go pass the gift shop and have your blood work completed.  We will only call you if the results are abnormal or if the provider would like to make medication changes.  Your sleep study was ordered through Dr. Levin Reamer office. Please contact them to follow-up on this.  Dr. Marti Slates schedule isn't yet open for 3 months out. We will place you on a recall list and contact you once management has opened up his schedule for April.

## 2023-07-30 ENCOUNTER — Telehealth: Payer: Self-pay | Admitting: *Deleted

## 2023-07-30 LAB — T3, FREE: T3, Free: 3.1 pg/mL (ref 2.0–4.4)

## 2023-07-30 NOTE — Telephone Encounter (Signed)
-----   Message from Nurse Misty Stanley R sent at 07/24/2023  2:42 PM EST ----- Regarding: Watch Pat Dr. Kirke Corin has ordered a Watch Pat for this patient-thank you

## 2023-07-30 NOTE — Telephone Encounter (Signed)
Ordering provider: DR Kirke Corin Associated diagnoses: G47.30 WatchPAT PA obtained on 07/30/2023 by Latrelle Dodrill, CMA. Authorization: No; tracking ID NO PA REQ Patient notified of PIN ([phone]) on 07/30/2023 via Notification Method: phone.

## 2023-07-31 ENCOUNTER — Other Ambulatory Visit (HOSPITAL_COMMUNITY): Payer: Self-pay

## 2023-07-31 ENCOUNTER — Telehealth: Payer: Self-pay | Admitting: Pharmacist

## 2023-07-31 NOTE — Telephone Encounter (Signed)
Patient is not eligible for Mounjaro, Ozempic due to no diagnosis of T2DM nor Wegovy or Zepbound due to insurance coverage. Patient made aware.

## 2023-08-05 ENCOUNTER — Institutional Professional Consult (permissible substitution): Payer: 59 | Admitting: Cardiology

## 2023-08-11 ENCOUNTER — Encounter (INDEPENDENT_AMBULATORY_CARE_PROVIDER_SITE_OTHER): Payer: 59 | Admitting: Cardiology

## 2023-08-11 ENCOUNTER — Encounter: Payer: Self-pay | Admitting: Cardiology

## 2023-08-11 ENCOUNTER — Ambulatory Visit: Payer: 59 | Attending: Cardiology | Admitting: Cardiology

## 2023-08-11 VITALS — BP 126/78 | HR 57 | Ht 71.0 in | Wt 250.4 lb

## 2023-08-11 DIAGNOSIS — I10 Essential (primary) hypertension: Secondary | ICD-10-CM

## 2023-08-11 DIAGNOSIS — D6869 Other thrombophilia: Secondary | ICD-10-CM

## 2023-08-11 DIAGNOSIS — I5021 Acute systolic (congestive) heart failure: Secondary | ICD-10-CM

## 2023-08-11 DIAGNOSIS — I4819 Other persistent atrial fibrillation: Secondary | ICD-10-CM

## 2023-08-11 DIAGNOSIS — G4733 Obstructive sleep apnea (adult) (pediatric): Secondary | ICD-10-CM | POA: Diagnosis not present

## 2023-08-11 NOTE — Patient Instructions (Addendum)
Medication Instructions:  Your physician recommends that you continue on your current medications as directed. Please refer to the Current Medication list given to you today.  *If you need a refill on your cardiac medications before your next appointment, please call your pharmacy*   Lab Work: BMET and CBC prior to your ablation - you may come to the [facility] office or you may go to any [facility] location  Testing/Procedures: Echocardiogram  Your physician has requested that you have an echocardiogram. Echocardiography is a painless test that uses sound waves to create images of your heart. It provides your doctor with information about the size and shape of your heart and how well your heart's chambers and valves are working. This procedure takes approximately one hour. There are no restrictions for this procedure. Please do NOT wear cologne, perfume, aftershave, or lotions (deodorant is allowed). Please arrive 15 minutes prior to your appointment time.  Cardiac CT Your physician has requested that you have cardiac CT. Cardiac computed tomography (CT) is a painless test that uses an x-ray machine to take clear, detailed pictures of your heart.  We will call you to schedule your CT scan. It will be done about three weeks prior to your ablation.  Ablation Your physician has recommended that you have an ablation. Catheter ablation is a medical procedure used to treat some cardiac arrhythmias (irregular heartbeats). During catheter ablation, a long, thin, flexible tube is put into a blood vessel in your groin (upper thigh), or neck. This tube is called an ablation catheter. It is then guided to your heart through the blood vessel. Radio frequency waves destroy small areas of heart tissue where abnormal heartbeats may cause an arrhythmia to start. You are scheduled for Atrial Fibrillation Ablation on Friday, March 14 with Dr. Michele Rockers.Please arrive at the Main Entrance A at [facility]: [address] [place], [place] [address] at 8:00 AM    Follow-Up: At [facility], you and your health needs are our priority.  As part of our continuing mission to provide you with exceptional heart care, we have created designated Provider Care Teams.  These Care Teams include your primary Cardiologist (physician) and Advanced Practice Providers (APPs -  Physician Assistants and Nurse Practitioners) who all work together to provide you with the care you need, when you need it.   Your next appointment:   We will call you to arrange your post-procedure follow up appointments

## 2023-08-12 NOTE — Progress Notes (Signed)
Electrophysiology Office Note:   Date:  08/12/2023  ID:  Ernest Boyd, DOB 19-May-1971, MRN [id number]  Primary Cardiologist: Lorine Bears, MD Electrophysiologist: Nobie Putnam, MD      History of Present Illness:   Ernest Boyd is a 53 y.o. 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 Gasper Lloyd.  Discussed the use of AI scribe software for clinical note transcription with the patient, who gave verbal consent to proceed.  History of Present Illness   The patient recently diagnosed with atrial fibrillation a few months ago. He complained to is PCP of increasing fatigue and shortness of breath. EKG was done which revealed new AF. He TEE with plans for cardioversion but had spontaneous echo contrast in LAA. He was continued on Eliquis for 3 weeks and brought back for TEE and DCCV. DCCV was unsuccessful. He has subsequently been on amiodarone. Significant symptoms in atrial fibrillation include fatigue and irregular heart rhythms. He has been intermittently maintaining normal rhythm on amiodarone.     Review of systems complete and found to be negative unless listed in HPI.   EP Information / Studies Reviewed:    EKG is not ordered today. EKG from 07/29/23 reviewed which showed sinus bradycardia.     EKG 07/24/23:   TTE 05/22/23:   1. Poor acoustic windows limit study. Definity used Cannot fully evaluate  regional wall motion as endocardium is not well seen in all walls.   2. Left ventricular ejection fraction, by estimation, is 55%. The left  ventricle has low normal function. There is mild left ventricular  hypertrophy.   3. Right ventricular systolic function is normal. The right ventricular  size is normal.   4. The mitral valve is normal in structure. Trivial mitral valve   regurgitation.   5. The aortic valve is tricuspid. Aortic valve regurgitation is not  visualized. Aortic valve sclerosis is present, with no evidence of aortic  valve stenosis.   6. The inferior vena cava is normal in size with greater than 50%  respiratory variability, suggesting right atrial pressure of 3 mmHg.   TEE 06/19/23:  1. Left ventricular ejection fraction, by estimation, is 35 to 40%. The  left ventricle has moderately decreased function. The left ventricle  demonstrates global hypokinesis.   2. Right ventricular systolic function is moderately reduced. The right  ventricular size is normal.   3. Left atrial size was moderately dilated. No left atrial/left atrial  appendage thrombus was detected.   4. Right atrial size was mildly dilated.   5. The mitral valve is normal in structure. Mild to moderate mitral valve  regurgitation. No evidence of mitral stenosis.   6. The aortic valve is normal in structure. There is mild calcification  of the aortic valve. Aortic valve regurgitation is not visualized. Aortic  valve sclerosis is present, with no evidence of aortic valve stenosis.   7. The inferior vena cava is normal in size with greater than 50%  respiratory variability, suggesting right atrial pressure of 3 mmHg.   8. Agitated saline contrast bubble study was negative, with no evidence  of any interatrial shunt.   Risk Assessment/Calculations:    CHA2DS2-VASc Score = 2   This indicates a 2.2% annual risk of stroke. The patient's score is based upon: CHF History: 1 HTN History: 1 Diabetes History:  0 Stroke History: 0 Vascular Disease History: 0 Age Score: 0 Gender Score: 0        STOP-Bang Score:  7       Physical Exam:   VS:  BP 126/78   Pulse (!) 57   Ht 5\' 11"  (1.803 m)   Wt 250 lb 6.4 oz (113.6 kg)   SpO2 97%   BMI 34.92 kg/m    Wt Readings from Last 3 Encounters:  08/11/23 250 lb 6.4 oz (113.6 kg)  07/29/23 252 lb (114.3 kg)  07/24/23 252 lb  (114.3 kg)     GEN: Well nourished, well developed in no acute distress NECK: No JVD CARDIAC: Bradycardic, regular RESPIRATORY:  Clear to auscultation without rales, wheezing or rhonchi  ABDOMEN: Soft, non-distended EXTREMITIES:  No edema; No deformity   ASSESSMENT AND PLAN:   Ernest Boyd is a 53 y.o. 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 Gasper Lloyd.  #. Persistent atrial fibrillation, symptomatic: Associated with drop in LVEF. Now paroxysmal on amiodarone. Would like to avoid long-term use of amiodarone due to age.  -Discussed treatment options today for AF including antiarrhythmic drug therapy - switching amiodarone to Tikosyn - and ablation. Discussed risks, recovery and likelihood of success with each treatment strategy. Risk, benefits, and alternatives to EP study and ablation for afib were discussed. These risks include but are not limited to stroke, bleeding, vascular damage, tamponade, perforation, damage to the esophagus, lungs, phrenic nerve and other structures, pulmonary vein stenosis, worsening renal function, coronary vasospasm and death.  Discussed potential need for repeat ablation procedures and antiarrhythmic drugs after an initial ablation. The patient understands these risk and wishes to proceed.  We will therefore proceed with catheter ablation at the next available time.  Carto, ICE, anesthesia are requested for the procedure.  Will also obtain CT PV protocol prior to the procedure to exclude LAA thrombus and further evaluate atrial anatomy and also see if we can assess coronaries. -Will try to induce AFL post PVI. Some ECGs appear like AFL vs coarse AF. -Continue amiodarone as bridge to ablation.  -Limited echo in a couple of weeks to better reassess LVEF in sinus  rhythm.  #. Acute systolic hear failure: EF 35-40% in setting of new AF. Suspect tachy/arrhythmia induced.  - He now appears to be maintaining predominantly in sinus rhythm. Will repeat echo in a couple of weeks to better assess. - Continue GDMT and follow up with primary cardiologist.   #Hypertension -At goal today.  Recommend checking blood pressures 1-2 times per week at home and recording the values.  Recommend bringing these recordings to the primary care physician.  Signed, Nobie Putnam, MD

## 2023-08-18 ENCOUNTER — Encounter (HOSPITAL_COMMUNITY): Payer: Self-pay

## 2023-08-19 ENCOUNTER — Encounter: Payer: Self-pay | Admitting: Cardiology

## 2023-08-19 ENCOUNTER — Ambulatory Visit: Payer: 59 | Attending: Cardiology

## 2023-08-19 DIAGNOSIS — I4819 Other persistent atrial fibrillation: Secondary | ICD-10-CM

## 2023-08-19 LAB — ECHOCARDIOGRAM LIMITED
AV Mean grad: 3 mm[Hg]
AV Peak grad: 5.6 mm[Hg]
Ao pk vel: 1.18 m/s
Area-P 1/2: 3.72 cm2
S' Lateral: 3.3 cm

## 2023-08-19 MED ORDER — PERFLUTREN LIPID MICROSPHERE
1.0000 mL | INTRAVENOUS | Status: AC | PRN
  Administered 2023-08-19: 2 mL via INTRAVENOUS

## 2023-08-20 ENCOUNTER — Ambulatory Visit: Admission: RE | Admit: 2023-08-20 | Payer: 59 | Source: Ambulatory Visit

## 2023-08-20 ENCOUNTER — Ambulatory Visit: Payer: 59 | Attending: Cardiovascular Disease

## 2023-08-20 DIAGNOSIS — G4730 Sleep apnea, unspecified: Secondary | ICD-10-CM

## 2023-08-25 ENCOUNTER — Ambulatory Visit (HOSPITAL_COMMUNITY): Payer: 59

## 2023-08-26 ENCOUNTER — Telehealth: Payer: Self-pay

## 2023-08-26 NOTE — Telephone Encounter (Signed)
-----   Message from Armanda Magic sent at 08/20/2023  7:17 PM EST ----- Please let patient know that they have sleep apnea and recommend treating with CPAP.  Please order an auto CPAP from 4-15cm H2O with heated humidity and mask of choice.  Order overnight pulse ox on CPAP.  Followup with me in 6 weeks.

## 2023-08-26 NOTE — Telephone Encounter (Signed)
Notified patient of sleep study results and recommendations. Patient would like an OV to discuss alternatives to the CPAP.

## 2023-08-31 ENCOUNTER — Telehealth (HOSPITAL_COMMUNITY): Payer: Self-pay

## 2023-08-31 NOTE — Telephone Encounter (Signed)
 Spoke with patient to complete one month pre-procedure call.     New medical conditions? No Recent hospitalizations or surgeries? No Started any new medications? No Patient made aware to contact office to inform of any new medications started. Any changes in activities of daily living? No  Pre-procedure testing scheduled: CT on 09/03/23 and lab work ordered. Confirmed patient is taking Eliquis and will continue taking medication before procedure or it may need to be rescheduled.  Confirmed patient is scheduled for Atrial Fibrillation Ablation on Friday, March 14 with Dr. Michele Rockers. Instructed patient to arrive at the Main Entrance A at [facility]: [address] [place], [place] [address] and check in at Admitting at 5:30 AM  Advised of plan to go home the same day and will only stay overnight if medically necessary. You MUST have a responsible adult to drive you home and MUST be with you the first 24 hours after you arrive home or your procedure could be cancelled.  Patient verbalized understanding to information provided and is agreeable to proceed with procedure.

## 2023-09-02 ENCOUNTER — Encounter: Payer: Self-pay | Admitting: Family

## 2023-09-02 DIAGNOSIS — G4733 Obstructive sleep apnea (adult) (pediatric): Secondary | ICD-10-CM | POA: Insufficient documentation

## 2023-09-03 ENCOUNTER — Ambulatory Visit
Admission: RE | Admit: 2023-09-03 | Discharge: 2023-09-03 | Disposition: A | Discharge status: Home / Self Care | Payer: 59 | Source: Ambulatory Visit | Attending: Cardiology | Admitting: Cardiology

## 2023-09-03 DIAGNOSIS — I4819 Other persistent atrial fibrillation: Secondary | ICD-10-CM | POA: Diagnosis not present

## 2023-09-03 MED ORDER — IOHEXOL 350 MG/ML SOLN
100.0000 mL | Freq: Once | INTRAVENOUS | Status: AC | PRN
Start: 2023-09-03 — End: 2023-09-03
  Administered 2023-09-03: 100 mL via INTRAVENOUS

## 2023-09-03 MED ORDER — NITROGLYCERIN 0.4 MG SL SUBL
0.8000 mg | SUBLINGUAL_TABLET | Freq: Once | SUBLINGUAL | Status: AC
  Administered 2023-09-03: 0.8 mg via SUBLINGUAL

## 2023-09-03 MED ORDER — SODIUM CHLORIDE 0.9 % IV SOLN: INTRAVENOUS | Status: DC

## 2023-09-03 NOTE — Progress Notes (Signed)
 Patient tolerated procedure well. Ambulate w/o difficulty. Denies light headedness or being dizzy. Sitting up drinking water provided. Encouraged to drink extra water today and reasoning explained. Verbalized understanding. All questions answered. ABC intact. No further needs. Discharge from procedure area w/o issues.

## 2023-09-04 ENCOUNTER — Encounter: Payer: Self-pay | Admitting: Cardiology

## 2023-09-14 ENCOUNTER — Other Ambulatory Visit: Payer: Self-pay

## 2023-09-14 DIAGNOSIS — I4819 Other persistent atrial fibrillation: Secondary | ICD-10-CM

## 2023-09-15 LAB — BASIC METABOLIC PANEL
BUN/Creatinine Ratio: 9 (ref 9–20)
BUN: 10 mg/dL (ref 6–24)
CO2: 25 mmol/L (ref 20–29)
Calcium: 9.7 mg/dL (ref 8.7–10.2)
Chloride: 101 mmol/L (ref 96–106)
Creatinine, Ser: 1.11 mg/dL (ref 0.76–1.27)
Glucose: 113 mg/dL — ABNORMAL HIGH (ref 70–99)
Potassium: 4.8 mmol/L (ref 3.5–5.2)
Sodium: 139 mmol/L (ref 134–144)
eGFR: 79 mL/min/{1.73_m2} (ref >59)

## 2023-09-15 LAB — CBC
Hematocrit: 52.9 % — ABNORMAL HIGH (ref 37.5–51.0)
Hemoglobin: 17.5 g/dL (ref 13.0–17.7)
MCH: 29.3 pg (ref 26.6–33.0)
MCHC: 33.1 g/dL (ref 31.5–35.7)
MCV: 89 fL (ref 79–97)
Platelets: 255 10*3/uL (ref 150–450)
RBC: 5.98 x10E6/uL — ABNORMAL HIGH (ref 4.14–5.80)
RDW: 14.3 % (ref 11.6–15.4)
WBC: 6.3 10*3/uL (ref 3.4–10.8)

## 2023-09-18 ENCOUNTER — Telehealth (HOSPITAL_COMMUNITY): Payer: Self-pay

## 2023-09-18 NOTE — Telephone Encounter (Signed)
 Call placed to patient to discuss upcoming procedure.   CT: completed and acceptable.  Labs: completed and acceptable.   Any recent signs of acute illness or been started on antibiotics? No Any medications to hold? Farxiga 3 days Any missed doses of blood thinner?  No Advised patient to continue taking ANTICOAGULANT: Eliquis (Apixaban) without missing any doses.  Medication instructions:  On the morning of your procedure DO NOT take any medication., including Eliquis or the procedure may be rescheduled. Nothing to eat or drink after midnight prior to your procedure.  Confirmed patient is scheduled for Atrial Fibrillation Ablation on Friday, March 14 with Dr. Michele Rockers. Instructed patient to arrive at the Main Entrance A at [facility]: [address] [place], [place] [address] and check in at Admitting at 5:30 AM   Advised of plan to go home the same day and will only stay overnight if medically necessary. You MUST have a responsible adult to drive you home and MUST be with you the first 24 hours after you arrive home or your procedure could be cancelled.  Patient verbalized understanding to all instructions provided and agreed to proceed with procedure.

## 2023-09-24 ENCOUNTER — Ambulatory Visit: Payer: 59 | Admitting: Dermatology

## 2023-09-24 NOTE — Pre-Procedure Instructions (Signed)
 Instructed patient on the following items: Arrival time 0530 Nothing to eat or drink after midnight No meds AM of procedure Responsible person to drive you home and stay with you for 24 hrs  Have you missed any doses of anti-coagulant Elqiuis- takes twice a day, hasn't missed any doses.  Don't take dose in the morning.
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
## 2023-09-25 ENCOUNTER — Ambulatory Visit (HOSPITAL_COMMUNITY): Admitting: Anesthesiology

## 2023-09-25 ENCOUNTER — Ambulatory Visit (HOSPITAL_COMMUNITY)
Admission: RE | Admit: 2023-09-25 | Discharge: 2023-09-25 | Disposition: A | Discharge status: Home / Self Care | Payer: 59 | Attending: Cardiology | Admitting: Cardiology

## 2023-09-25 ENCOUNTER — Encounter (HOSPITAL_COMMUNITY)
Admission: RE | Disposition: A | Discharge status: Home / Self Care | Payer: Self-pay | Source: Home / Self Care | Attending: Cardiology

## 2023-09-25 ENCOUNTER — Other Ambulatory Visit: Payer: Self-pay

## 2023-09-25 ENCOUNTER — Encounter (HOSPITAL_COMMUNITY): Payer: Self-pay | Admitting: Cardiology

## 2023-09-25 ENCOUNTER — Ambulatory Visit (HOSPITAL_BASED_OUTPATIENT_CLINIC_OR_DEPARTMENT_OTHER): Admitting: Anesthesiology

## 2023-09-25 DIAGNOSIS — I4819 Other persistent atrial fibrillation: Secondary | ICD-10-CM | POA: Diagnosis present

## 2023-09-25 DIAGNOSIS — I5022 Chronic systolic (congestive) heart failure: Secondary | ICD-10-CM | POA: Insufficient documentation

## 2023-09-25 DIAGNOSIS — Z7901 Long term (current) use of anticoagulants: Secondary | ICD-10-CM | POA: Insufficient documentation

## 2023-09-25 DIAGNOSIS — I2510 Atherosclerotic heart disease of native coronary artery without angina pectoris: Secondary | ICD-10-CM | POA: Diagnosis not present

## 2023-09-25 DIAGNOSIS — I4892 Unspecified atrial flutter: Secondary | ICD-10-CM | POA: Diagnosis not present

## 2023-09-25 DIAGNOSIS — I110 Hypertensive heart disease with heart failure: Secondary | ICD-10-CM | POA: Insufficient documentation

## 2023-09-25 DIAGNOSIS — I4891 Unspecified atrial fibrillation: Secondary | ICD-10-CM

## 2023-09-25 DIAGNOSIS — G4733 Obstructive sleep apnea (adult) (pediatric): Secondary | ICD-10-CM | POA: Diagnosis not present

## 2023-09-25 DIAGNOSIS — E785 Hyperlipidemia, unspecified: Secondary | ICD-10-CM | POA: Insufficient documentation

## 2023-09-25 HISTORY — PX: ATRIAL FIBRILLATION ABLATION: EP1191

## 2023-09-25 SURGERY — ATRIAL FIBRILLATION ABLATION
Anesthesia: General

## 2023-09-25 MED ORDER — SODIUM CHLORIDE 0.9 % IV SOLN: 250.0000 mL | INTRAVENOUS | Status: DC | PRN

## 2023-09-25 MED ORDER — ATROPINE SULFATE 1 MG/10ML IJ SOSY
PREFILLED_SYRINGE | INTRAMUSCULAR | Status: DC | PRN
Start: 2023-09-25 — End: 2023-09-25
  Administered 2023-09-25: 1 mg via INTRAVENOUS

## 2023-09-25 MED ORDER — PHENYLEPHRINE 80 MCG/ML (10ML) SYRINGE FOR IV PUSH (FOR BLOOD PRESSURE SUPPORT)
PREFILLED_SYRINGE | INTRAVENOUS | Status: DC | PRN
  Administered 2023-09-25 (×2): 200 ug via INTRAVENOUS

## 2023-09-25 MED ORDER — MIDAZOLAM HCL 2 MG/2ML IJ SOLN
INTRAMUSCULAR | Status: AC
Start: 2023-09-25 — End: 2023-09-25
  Filled 2023-09-25: qty 2

## 2023-09-25 MED ORDER — MIDAZOLAM HCL 2 MG/2ML IJ SOLN
INTRAMUSCULAR | Status: DC | PRN
  Administered 2023-09-25: 2 mg via INTRAVENOUS

## 2023-09-25 MED ORDER — FENTANYL CITRATE (PF) 100 MCG/2ML IJ SOLN
INTRAMUSCULAR | Status: AC
  Filled 2023-09-25: qty 2

## 2023-09-25 MED ORDER — PROPOFOL 10 MG/ML IV BOLUS
INTRAVENOUS | Status: DC | PRN
  Administered 2023-09-25 (×2): 40 mg via INTRAVENOUS
  Administered 2023-09-25: 200 mg via INTRAVENOUS

## 2023-09-25 MED ORDER — FENTANYL CITRATE (PF) 100 MCG/2ML IJ SOLN
INTRAMUSCULAR | Status: DC | PRN
Start: 2023-09-25 — End: 2023-09-25
  Administered 2023-09-25: 100 ug via INTRAVENOUS

## 2023-09-25 MED ORDER — ONDANSETRON HCL 4 MG/2ML IJ SOLN
INTRAMUSCULAR | Status: DC | PRN
  Administered 2023-09-25: 4 mg via INTRAVENOUS

## 2023-09-25 MED ORDER — LIDOCAINE 2% (20 MG/ML) 5 ML SYRINGE
INTRAMUSCULAR | Status: DC | PRN
  Administered 2023-09-25: 100 mg via INTRAVENOUS

## 2023-09-25 MED ORDER — DEXAMETHASONE SODIUM PHOSPHATE 10 MG/ML IJ SOLN
INTRAMUSCULAR | Status: DC | PRN
  Administered 2023-09-25: 8 mg via INTRAVENOUS

## 2023-09-25 MED ORDER — AMIODARONE HCL 200 MG PO TABS: 200.0000 mg | ORAL_TABLET | Freq: Every day | ORAL | 0 refills | Status: DC

## 2023-09-25 MED ORDER — NITROGLYCERIN 1 MG/10 ML FOR IR/CATH LAB
INTRA_ARTERIAL | Status: DC | PRN
  Administered 2023-09-25: 20 mL

## 2023-09-25 MED ORDER — NITROGLYCERIN 1 MG/10 ML FOR IR/CATH LAB
INTRA_ARTERIAL | Status: AC
  Filled 2023-09-25: qty 20

## 2023-09-25 MED ORDER — OXYCODONE HCL 5 MG PO TABS: 5.0000 mg | ORAL_TABLET | Freq: Once | ORAL | Status: DC | PRN

## 2023-09-25 MED ORDER — OXYCODONE HCL 5 MG/5ML PO SOLN: 5.0000 mg | Freq: Once | ORAL | Status: DC | PRN

## 2023-09-25 MED ORDER — APIXABAN 5 MG PO TABS
5.0000 mg | ORAL_TABLET | Freq: Once | ORAL | Status: AC
  Administered 2023-09-25: 5 mg via ORAL
  Filled 2023-09-25: qty 1

## 2023-09-25 MED ORDER — HEPARIN (PORCINE) IN NACL 1000-0.9 UT/500ML-% IV SOLN
INTRAVENOUS | Status: DC | PRN
  Administered 2023-09-25 (×3): 500 mL

## 2023-09-25 MED ORDER — ROCURONIUM BROMIDE 10 MG/ML (PF) SYRINGE
PREFILLED_SYRINGE | INTRAVENOUS | Status: DC | PRN
  Administered 2023-09-25: 70 mg via INTRAVENOUS
  Administered 2023-09-25 (×2): 10 mg via INTRAVENOUS

## 2023-09-25 MED ORDER — ATROPINE SULFATE 1 MG/10ML IJ SOSY
PREFILLED_SYRINGE | INTRAMUSCULAR | Status: AC
  Filled 2023-09-25: qty 10

## 2023-09-25 MED ORDER — ACETAMINOPHEN 500 MG PO TABS
1000.0000 mg | ORAL_TABLET | Freq: Once | ORAL | Status: AC
Start: 2023-09-25 — End: 2023-09-25
  Administered 2023-09-25: 1000 mg via ORAL
  Filled 2023-09-25: qty 2

## 2023-09-25 MED ORDER — SODIUM CHLORIDE 0.9 % IV SOLN: INTRAVENOUS | Status: DC

## 2023-09-25 MED ORDER — PHENYLEPHRINE HCL-NACL 20-0.9 MG/250ML-% IV SOLN
INTRAVENOUS | Status: DC | PRN
  Administered 2023-09-25: 40 ug/min via INTRAVENOUS

## 2023-09-25 MED ORDER — AMISULPRIDE (ANTIEMETIC) 5 MG/2ML IV SOLN: 10.0000 mg | Freq: Once | INTRAVENOUS | Status: DC | PRN

## 2023-09-25 MED ORDER — PROTAMINE SULFATE 10 MG/ML IV SOLN
INTRAVENOUS | Status: DC | PRN
  Administered 2023-09-25: 35 mg via INTRAVENOUS

## 2023-09-25 MED ORDER — SODIUM CHLORIDE 0.9% FLUSH: 3.0000 mL | Freq: Two times a day (BID) | INTRAVENOUS | Status: DC

## 2023-09-25 MED ORDER — ONDANSETRON HCL 4 MG/2ML IJ SOLN: 4.0000 mg | Freq: Four times a day (QID) | INTRAMUSCULAR | Status: DC | PRN

## 2023-09-25 MED ORDER — SUGAMMADEX SODIUM 200 MG/2ML IV SOLN
INTRAVENOUS | Status: DC | PRN
  Administered 2023-09-25: 222.2 mg via INTRAVENOUS

## 2023-09-25 MED ORDER — FENTANYL CITRATE (PF) 100 MCG/2ML IJ SOLN: 25.0000 ug | INTRAMUSCULAR | Status: DC | PRN

## 2023-09-25 MED ORDER — SODIUM CHLORIDE 0.9% FLUSH: 3.0000 mL | INTRAVENOUS | Status: DC | PRN

## 2023-09-25 MED ORDER — HEPARIN SODIUM (PORCINE) 1000 UNIT/ML IJ SOLN
INTRAMUSCULAR | Status: DC | PRN
  Administered 2023-09-25: 1000 [IU] via INTRAVENOUS
  Administered 2023-09-25: 2000 [IU] via INTRAVENOUS
  Administered 2023-09-25: 16000 [IU] via INTRAVENOUS

## 2023-09-25 SURGICAL SUPPLY — 22 items
BAG SNAP BAND KOVER 36X36 (MISCELLANEOUS) IMPLANT
BLANKET WARM UNDERBOD FULL ACC (MISCELLANEOUS) ×1 IMPLANT
CABLE PFA RX CATH CONN (CABLE) IMPLANT
CATH BI DIR 7FR CS F-J 12 PIN (CATHETERS) IMPLANT
CATH FARAWAVE ABLATION 31 (CATHETERS) IMPLANT
CATH OCTARAY 2.0 F 3-3-3-3-3 (CATHETERS) IMPLANT
CATH SOUNDSTAR ECO 8FR (CATHETERS) IMPLANT
CLOSURE PERCLOSE PROSTYLE (VASCULAR PRODUCTS) IMPLANT
COVER SWIFTLINK CONNECTOR (BAG) ×1 IMPLANT
DEVICE CLOSURE MYNXGRIP 6/7F (Vascular Products) IMPLANT
DILATOR VESSEL 38 20CM 16FR (INTRODUCER) IMPLANT
GUIDEWIRE INQWIRE 1.5J.035X260 (WIRE) IMPLANT
INQWIRE 1.5J .035X260CM (WIRE) ×1 IMPLANT
KIT VERSACROSS CNCT FARADRIVE (KITS) IMPLANT
MAT PREVALON FULL STRYKER (MISCELLANEOUS) IMPLANT
PACK EP LF (CUSTOM PROCEDURE TRAY) ×1 IMPLANT
PAD DEFIB RADIO PHYSIO CONN (PAD) ×1 IMPLANT
PATCH CARTO3 (PAD) IMPLANT
SHEATH FARADRIVE STEERABLE (SHEATH) IMPLANT
SHEATH PINNACLE 8F 10CM (SHEATH) IMPLANT
SHEATH PINNACLE 9F 10CM (SHEATH) IMPLANT
SHEATH PROBE COVER 6X72 (BAG) IMPLANT

## 2023-09-25 NOTE — Transfer of Care (Signed)
 Immediate Anesthesia Transfer of Care Note  Patient: [name]  Procedure(s) Performed: ATRIAL FIBRILLATION ABLATION  Patient Location: Cath Lab  Anesthesia Type:General  Level of Consciousness: drowsy  Airway & Oxygen Therapy: Patient Spontanous Breathing and Patient connected to nasal cannula oxygen  Post-op Assessment: Report given to RN and Post -op Vital signs reviewed and stable  Post vital signs: Reviewed and stable  Last Vitals:  Vitals Value Taken Time  BP 101/72 09/25/23 1010  Temp    Pulse 68 09/25/23 1013  Resp 16 09/25/23 1013  SpO2 94 % 09/25/23 1013  Vitals shown include unfiled device data.  Last Pain:  Vitals:   09/25/23 0601  TempSrc:   PainSc: 0-No pain         Complications: There were no known notable events for this encounter.

## 2023-09-25 NOTE — Progress Notes (Signed)
 Patient and wife was given discharge instructions. Both verbalized understanding.

## 2023-09-25 NOTE — Discharge Instructions (Addendum)
 Stop amiodarone in 1 month.  Cardiac Ablation, Care After  This sheet gives you information about how to care for yourself after your procedure. Your health care provider may also give you more specific instructions. If you have problems or questions, contact your health care provider. What can I expect after the procedure? After the procedure, it is common to have: Bruising around your puncture site. Tenderness around your puncture site. Skipped heartbeats. If you had an atrial fibrillation ablation, you may have atrial fibrillation during the first several months after your procedure.  Tiredness (fatigue).  Follow these instructions at home: Puncture site care  Follow instructions from your health care provider about how to take care of your puncture site. Make sure you: If present, leave stitches (sutures), skin glue, or adhesive strips in place. These skin closures may need to stay in place for up to 2 weeks. If adhesive strip edges start to loosen and curl up, you may trim the loose edges. Do not remove adhesive strips completely unless your health care provider tells you to do that. If a large square bandage is present, this may be removed 24 hours after surgery.  Check your puncture site every day for signs of infection. Check for: Redness, swelling, or pain. Fluid or blood. If your puncture site starts to bleed, lie down on your back, apply firm pressure to the area, and contact your health care provider. Warmth. Pus or a bad smell. A pea or marble sized lump/knot at the site is normal and can take up to three months to resolve.  Driving Do not drive for at least 4 days after your procedure or however long your health care provider recommends. (Do not resume driving if you have previously been instructed not to drive for other health reasons.) Do not drive or use heavy machinery while taking prescription pain medicine. Activity Avoid activities that take a lot of effort for at  least 7 days after your procedure. Do not lift anything that is heavier than 5 lb (4.5 kg) for one week.  No sexual activity for 1 week.  Return to your normal activities as told by your health care provider. Ask your health care provider what activities are safe for you. General instructions Take over-the-counter and prescription medicines only as told by your health care provider. Do not use any products that contain nicotine or tobacco, such as cigarettes and e-cigarettes. If you need help quitting, ask your health care provider. You may shower after 24 hours, but Do not take baths, swim, or use a hot tub for 1 week.  Do not drink alcohol for 24 hours after your procedure. Keep all follow-up visits as told by your health care provider. This is important. Contact a health care provider if: You have redness, mild swelling, or pain around your puncture site. You have fluid or blood coming from your puncture site that stops after applying firm pressure to the area. Your puncture site feels warm to the touch. You have pus or a bad smell coming from your puncture site. You have a fever. You have chest pain or discomfort that spreads to your neck, jaw, or arm. You have chest pain that is worse with lying on your back or taking a deep breath. You are sweating a lot. You feel nauseous. You have a fast or irregular heartbeat. You have shortness of breath. You are dizzy or light-headed and feel the need to lie down. You have pain or numbness in the arm or  leg closest to your puncture site. Get help right away if: Your puncture site suddenly swells. Your puncture site is bleeding and the bleeding does not stop after applying firm pressure to the area. These symptoms may represent a serious problem that is an emergency. Do not wait to see if the symptoms will go away. Get medical help right away. Call your local emergency services (911 in the [place] Do not drive yourself to the  hospital. Summary After the procedure, it is normal to have bruising and tenderness at the puncture site in your groin, neck, or forearm. Check your puncture site every day for signs of infection. Get help right away if your puncture site is bleeding and the bleeding does not stop after applying firm pressure to the area. This is a medical emergency. This information is not intended to replace advice given to you by your health care provider. Make sure you discuss any questions you have with your health care provider.

## 2023-09-25 NOTE — Progress Notes (Signed)
  Pt arrived from EP via Bed to HA 20. Report received from RN and CRNA. Pt vitals are stable, performed q49min see vitals flowsheet. Anesthesia recovery has been uneventful. Pt to transition to short stay for remainder of recovery. Will continue to monitor patient while under holding area care.

## 2023-09-25 NOTE — Anesthesia Postprocedure Evaluation (Signed)
 Anesthesia Post Note  Patient: Ernest Boyd  Procedure(s) Performed: ATRIAL FIBRILLATION ABLATION     Patient location during evaluation: PACU Anesthesia Type: General Level of consciousness: awake Pain management: pain level controlled Vital Signs Assessment: post-procedure vital signs reviewed and stable Respiratory status: spontaneous breathing, nonlabored ventilation and respiratory function stable Cardiovascular status: blood pressure returned to baseline and stable Postop Assessment: no apparent nausea or vomiting Anesthetic complications: no   There were no known notable events for this encounter.  Last Vitals:  Vitals:   09/25/23 1200 09/25/23 1300  BP: 105/78 118/89  Pulse: (!) 59 (!) 59  Resp: 14 16  Temp:    SpO2: 95% 93%    Last Pain:  Vitals:   09/25/23 1045  TempSrc:   PainSc: 1                  [name]

## 2023-09-25 NOTE — Anesthesia Procedure Notes (Signed)
 Procedure Name: Intubation Date/Time: 09/25/2023 7:51 AM  Performed by: Randon Goldsmith, CRNAPre-anesthesia Checklist: Patient identified, Emergency Drugs available, Suction available and Patient being monitored Patient Re-evaluated:Patient Re-evaluated prior to induction Oxygen Delivery Method: Circle system utilized Preoxygenation: Pre-oxygenation with 100% oxygen Induction Type: IV induction Ventilation: Mask ventilation without difficulty and Oral airway inserted - appropriate to patient size Laryngoscope Size: Glidescope and 4 Grade View: Grade I Tube type: Oral Tube size: 7.5 mm Number of attempts: 1 Airway Equipment and Method: Stylet and Oral airway Placement Confirmation: ETT inserted through vocal cords under direct vision, positive ETCO2 and breath sounds checked- equal and bilateral Secured at: 22 cm Tube secured with: Tape Dental Injury: Teeth and Oropharynx as per pre-operative assessment  Comments: Elective glidescope intubation

## 2023-09-25 NOTE — H&P (Signed)
 Electrophysiology Note:   Date:  09/25/2023  ID:  [name], DOB Oct 14, 1970, MRN [id number]   Primary Cardiologist: Lorine Bears, MD Electrophysiologist: Nobie Putnam, MD       History of Present Illness:   JAROM GOVAN is a 53 y.o. 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 Gasper Lloyd.   Discussed the use of AI scribe software for clinical note transcription with the patient, who gave verbal consent to proceed.   History of Present Illness   The patient recently diagnosed with atrial fibrillation a few months ago. He complained to is PCP of increasing fatigue and shortness of breath. EKG was done which revealed new AF. He TEE with plans for cardioversion but had spontaneous echo contrast in L[facility]. He was continued on Eliquis for 3 weeks and brought back for TEE and DCCV. DCCV was unsuccessful. He has subsequently been on amiodarone. Significant symptoms in atrial fibrillation include fatigue and irregular heart rhythms. He has been intermittently maintaining normal rhythm on amiodarone.   Interval: Patient presents today for scheduled ablation. Reports doing relatively well. No missed doses of anti-coagulation. No new or acute complaints.    Review of systems complete and found to be negative unless listed in HPI.    EP Information / Studies Reviewed:       EKG 07/24/23:    TTE 05/22/23:   1. Poor acoustic windows limit study. Definity used Cannot fully evaluate  regional wall motion as endocardium is not well seen in all walls.   2. Left ventricular ejection fraction, by estimation, is 55%. The left  ventricle has low normal function. There is mild left ventricular  hypertrophy.   3. Right ventricular systolic function is normal. The right ventricular  size is normal.   4.  The mitral valve is normal in structure. Trivial mitral valve  regurgitation.   5. The aortic valve is tricuspid. Aortic valve regurgitation is not  visualized. Aortic valve sclerosis is present, with no evidence of aortic  valve stenosis.   6. The inferior vena cava is normal in size with greater than 50%  respiratory variability, suggesting right atrial pressure of 3 mmHg.    TEE 06/19/23:  1. Left ventricular ejection fraction, by estimation, is 35 to 40%. The  left ventricle has moderately decreased function. The left ventricle  demonstrates global hypokinesis.   2. Right ventricular systolic function is moderately reduced. The right  ventricular size is normal.   3. Left atrial size was moderately dilated. No left atrial/left atrial  appendage thrombus was detected.   4. Right atrial size was mildly dilated.   5. The mitral valve is normal in structure. Mild to moderate mitral valve  regurgitation. No evidence of mitral stenosis.   6. The aortic valve is normal in structure. There is mild calcification  of the aortic valve. Aortic valve regurgitation is not visualized. Aortic  valve sclerosis is present, with no evidence of aortic valve stenosis.   7. The inferior vena cava is normal in size with greater than 50%  respiratory variability, suggesting right atrial pressure of 3 mmHg.   8. Agitated saline contrast bubble study was negative, with no evidence  of any interatrial shunt.    Risk Assessment/Calculations:     CHA2DS2-VASc Score = 2   This indicates a 2.2% annual  risk of stroke. The patient's score is based upon: CHF History: 1 HTN History: 1 Diabetes History: 0 Stroke History: 0 Vascular Disease History: 0 Age Score: 0 Gender Score: 0         STOP-Bang Score:  7        Physical Exam:    Today's Vitals   09/25/23 0556 09/25/23 0601  BP: 134/89   Pulse: (!) 57   Resp: 20   Temp: 98.2 F (36.8 C)   TempSrc: Oral   SpO2: 95%   Weight: 111.1 kg   Height:  5\' 11"  (1.803 m)   PainSc:  0-No pain   Body mass index is 34.17 kg/m.   GEN: Well nourished, well developed in no acute distress NECK: No JVD CARDIAC: Bradycardic, regular RESPIRATORY:  Clear to auscultation without rales, wheezing or rhonchi  ABDOMEN: Soft, non-distended EXTREMITIES:  No edema; No deformity    ASSESSMENT AND PLAN:   CORREY WEIDNER is a 53 y.o. 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 Gasper Lloyd.   #. Persistent atrial fibrillation, symptomatic: Associated with drop in LVEF. Now paroxysmal on amiodarone. Would like to avoid long-term use of amiodarone due to age.  -Discussed treatment options today for AF including antiarrhythmic drug therapy - switching amiodarone to Tikosyn - and ablation. Discussed risks, recovery and likelihood of success with each treatment strategy. Risk, benefits, and alternatives to EP study and ablation for afib were discussed. These risks include but are not limited to stroke, bleeding, vascular damage, tamponade, perforation, damage to the esophagus, lungs, phrenic nerve and other structures, pulmonary vein stenosis, worsening renal function, coronary vasospasm and death.  Discussed potential need for repeat ablation procedures and antiarrhythmic drugs after an initial ablation. The patient understands these risk and wishes to proceed.  We will therefore proceed with catheter ablation today. -Will try to induce AFL post PVI. Some ECGs appear like AFL vs coarse AF. -Continue amiodarone as bridge to ablation. Will plan to stop in 1 month.     Signed, Nobie Putnam, MD

## 2023-09-28 ENCOUNTER — Encounter (HOSPITAL_COMMUNITY): Payer: Self-pay | Admitting: Cardiology

## 2023-09-28 ENCOUNTER — Telehealth (HOSPITAL_COMMUNITY): Payer: Self-pay

## 2023-09-28 NOTE — Progress Notes (Signed)
 noted

## 2023-09-28 NOTE — Telephone Encounter (Signed)
 Attempted to reach patient to follow up with procedure completed on 09/25/23, no answer. Voicemail box full- unable to leave VM.

## 2023-09-28 NOTE — Telephone Encounter (Signed)
 Spoke with patient to complete post procedure follow up call.  Patient removed large bandage at puncture sites after 24 hours and reports no complications with groin sites.   Instructions reviewed with patient:  It is normal to have bruising, tenderness and a pea or marble sized lump/knot at the groin site which can take up to three months to resolve.  Get help right away if you notice sudden swelling at the puncture site.  Check your puncture site every day for signs of infection: fever, redness, swelling, pus drainage, warmth, foul odor or excessive pain. If this occurs, please call the office at [phone], to speak with the nurse. Get help right away if your puncture site is bleeding and the bleeding does not stop after applying firm pressure to the area.  You may continue to have skipped beats/ atrial fibrillation during the first several months after your procedure.  It is very important not to miss any doses of your blood thinner Eliquis. Patient restarted taking this medication on 09/25/23.   You will follow up with the APP on 10/23/23 and follow up with the APP on 12/29/23.   Patient verbalized understanding to all instructions provided.

## 2023-09-29 LAB — POCT ACTIVATED CLOTTING TIME
Activated Clotting Time: 320 s
Activated Clotting Time: 320 s

## 2023-09-30 NOTE — Progress Notes (Signed)
 noted

## 2023-10-05 ENCOUNTER — Encounter: Payer: Self-pay | Admitting: Family

## 2023-10-08 ENCOUNTER — Ambulatory Visit: Admitting: Cardiology

## 2023-10-22 NOTE — Progress Notes (Unsigned)
 Electrophysiology Clinic Note    Date:  10/23/2023  Patient ID:  Ernest, Boyd [birth year], MRN [id number] PCP:  Mort Sawyers, FNP  Cardiologist:  Lorine Bears, MD Electrophysiologist: Nobie Putnam, MD    Discussed the use of AI scribe software for clinical note transcription with the patient, who gave verbal consent to proceed.   Patient Profile    Chief Complaint: AF follow-up  History of Present Illness: Ernest Boyd is a 53 y.o. male with PMH notable for persis Afib, HFimpEF, non-obs CAD, HTN; seen today for Nobie Putnam, MD for routine electrophysiology followup.   He was started on amiodarone after discovered to have low EF while in AFib.  He is s/p AF ablation w PVI, posterior wall, and CTI 09/25/23 by Dr. Jimmey Ralph.   On follow-up today, he is not aware of any AF episodes since ablation. He was minimally symptomatic of his AF though, maybe some increased fatigue. He uses a pulse ox and BP cuff to monitor HR and it has been consistently in the 50-60s since his ablation.  His BP has been relatively well controlled, he mentions one reading int he 160 systolic. Otherwise, has been in the 120-130s.  He has attempted to increase physical activity, he and his wife are walking more regularly. He has not been taking his repatha as prescribed.  He is taking eliquis BID, no missed doses, no bleeding concerns. His groins are completely healed from procedure. He completed a home sleep study but is not aware of the results. He is unwilling to use a CPAP, but will consider an oral appliance or nasal appliance.      Arrhythmia/Device History Amiodarone       ROS:  Please see the history of present illness. All other systems are reviewed and otherwise negative.    Physical Exam    VS:  BP 128/72   Pulse 67   Ht 5\' 11"  (1.803 m)   Wt 252 lb 12.8 oz (114.7 kg)   SpO2 94%   BMI 35.26 kg/m  BMI: Body mass index is 35.26 kg/m.  Wt Readings from Last 3 Encounters:   10/23/23 252 lb 12.8 oz (114.7 kg)  09/25/23 245 lb (111.1 kg)  08/11/23 250 lb 6.4 oz (113.6 kg)     GEN- The patient is well appearing, alert and oriented x 3 today.   Lungs- Clear to ausculation bilaterally, normal work of breathing.  Heart- Regular rate and rhythm, no murmurs, rubs or gallops Extremities- No peripheral edema, warm, dry   Studies Reviewed   Previous EP, cardiology notes.    EKG is ordered. Personal review of EKG from today shows:    EKG Interpretation Date/Time:  Friday October 23 2023 10:05:35 EDT Ventricular Rate:  67 PR Interval:  172 QRS Duration:  88 QT Interval:  422 QTC Calculation: 445 R Axis:   20  Text Interpretation: Normal sinus rhythm Normal ECG Confirmed by Sherie Don [phone]) on 10/23/2023 10:07:54 AM     Cardiac CTA, 09/03/2023 1. There is normal pulmonary vein drainage into the left atrium. (3 on the right and 2 on the left) with ostial measurements as above.  2. The left atrial appendage is a Cauliflower type with ostial size 23 x 12 mm and length 33 mm, Area 21 mm2. There is no thrombus in the left atrial appendage.  3. The esophagus runs in the left atrial midline and is not in the proximity to any of the pulmonary veins.  Coronary CTA, 09/03/2023 1. Coronary calcium score of 1470. This was 99th percentile for age and sex matched control.  2. Normal coronary origin with right dominance.  3. Mild stenosis in RCA, LAD, LCx (25-49%).  4. CAD-RADS 2. Mild non-obstructive CAD (25-49%). Consider preventive therapy and risk factor modification.  TTE, 08/19/2023  1. Left ventricular ejection fraction, by estimation, is 60 to 65%. The left ventricle has normal function. The left ventricle has no regional wall motion abnormalities. Left ventricular diastolic parameters were normal.   2. Right ventricular systolic function is normal. The right ventricular size is normal. Tricuspid regurgitation signal is inadequate for assessing PA pressure.   3.  The mitral valve is normal in structure. No evidence of mitral valve regurgitation. No evidence of mitral stenosis.   4. The aortic valve is normal in structure. Aortic valve regurgitation is not visualized. Aortic valve sclerosis is present, with no evidence of aortic valve stenosis. Aortic valve mean gradient measures 3.0 mmHg.   5. The inferior vena cava is normal in size with greater than 50% respiratory variability, suggesting right atrial pressure of 3 mmHg.    Assessment and Plan     #) persis AFib #) aflutter #) amiodarone monitoring S/p AF, Aflutter ablation 09/2023 by Dr. Jimmey Ralph Maintaining sinus on 200mg  amiodarone, will stop today Recent LFTs, thyroid labs stable Continue 25mg  toprol  #) Hypercoag d/t persis afib CHA2DS2-VASc Score = at least 3 [CHF History: 1, HTN History: 1, Diabetes History: 0, Stroke History: 0, Vascular Disease History: 1, Age Score: 0, Gender Score: 0].  Therefore, the patient's annual risk of stroke is 3.2 %.    Stroke ppx - 5mg  eliquis BID, appropriately dosed No bleeding concerns  #) HFimpEF NYHA II symptoms Warm and dry on exam Thought to be tachy-mediated Follows with HF team GDMT - entresto, spiro, farxiga, toprol Has lasix to use PRN, has never needed it  #) elevated coronary calcium score Significantly elevated coronary calcium score on pre-AF ablation CT Highly recommend he resume Repatha Recommended increase physical activity, low fat diet  #) OSA Recommended treatment of his mild OSA He requested to discuss further mgmt with pulm Will refer to pulm        Current medicines are reviewed at length with the patient today.   The patient has concerns regarding his medicines.  The following changes were made today:   STOP amiodarone  Labs/ tests ordered today include:  Orders Placed This Encounter  Procedures   Ambulatory referral to Pulmonology   EKG 12-Lead     Disposition: Follow up with Dr. Jimmey Ralph or EP APP  in 2  months    Signed, Sherie Don, NP  10/23/23  4:03 PM  Electrophysiology [facility]

## 2023-10-23 ENCOUNTER — Ambulatory Visit: Attending: Cardiology | Admitting: Cardiology

## 2023-10-23 ENCOUNTER — Encounter: Payer: Self-pay | Admitting: Cardiology

## 2023-10-23 VITALS — BP 128/72 | HR 67 | Ht 71.0 in | Wt 252.8 lb

## 2023-10-23 DIAGNOSIS — D6869 Other thrombophilia: Secondary | ICD-10-CM

## 2023-10-23 DIAGNOSIS — G4733 Obstructive sleep apnea (adult) (pediatric): Secondary | ICD-10-CM

## 2023-10-23 DIAGNOSIS — I4892 Unspecified atrial flutter: Secondary | ICD-10-CM

## 2023-10-23 DIAGNOSIS — I4819 Other persistent atrial fibrillation: Secondary | ICD-10-CM

## 2023-10-23 DIAGNOSIS — I5032 Chronic diastolic (congestive) heart failure: Secondary | ICD-10-CM | POA: Diagnosis not present

## 2023-10-23 NOTE — Patient Instructions (Addendum)
 Medication Instructions:   Stop  taking amiodarone  *If you need a refill on your cardiac medications before your next appointment, please call your pharmacy*   Lab Work: Not needed   Testing/Procedures:  Not needed   Follow-Up: At [facility], you and your health needs are our priority.  As part of our continuing mission to provide you with exceptional heart care, we have created designated Provider Care Teams.  These Care Teams include your primary Cardiologist (physician) and Advanced Practice Providers (APPs -  Physician Assistants and Nurse Practitioners) who all work together to provide you with the care you need, when you need it.     Your next appointment:   2 month(s)  (12/29/23)  The format for your next appointment:   In Person  Provider:   Sherie Don, NP

## 2023-11-16 ENCOUNTER — Ambulatory Visit: Admitting: Dermatology

## 2023-11-18 ENCOUNTER — Ambulatory Visit: Admitting: Cardiology

## 2023-12-28 ENCOUNTER — Encounter: Payer: Self-pay | Admitting: Cardiology

## 2023-12-28 NOTE — Progress Notes (Signed)
 Electrophysiology Clinic Note    Date:  12/29/2023  Patient ID:  Ernest Boyd Jan 14, 1971, MRN [id number] PCP:  Felicita Horns, FNP  Cardiologist:  Antionette Kirks, MD Electrophysiologist: Ardeen Kohler, MD   Discussed the use of AI scribe software for clinical note transcription with the patient, who gave verbal consent to proceed.   Patient Profile    Chief Complaint: AF follow-up  History of Present Illness: Ernest Boyd is a 53 y.o. male with PMH notable for persis Afib, HFimpEF, non-obs CAD, HTN; seen today for Ardeen Kohler, MD for routine electrophysiology followup.   He was started on amiodarone  after discovered to have low EF while in AFib.  He is s/p AF ablation w PVI, posterior wall, and CTI 09/25/23 by Dr. Daneil Dunker.  I saw him for routine 1 month post-ablation follow-up where he was maintaining sinus rhythm. His heart rates were frequently 50-60. Amio was stopped at that appt and metop continued.   On follow-up today, he is not aware of any AF episodes since ablation, though historically was mostly asymptomatic of arrhythmia. He has noticed a couple dizzy episodes in the evenings after he takes a shower, does not happen every evening but has happened 2-3 times in the past 2 weeks. These episodes lasts a couple seconds and resolve quickly. NO chest pain, chest discomfort, or palpitations during episodes.  No recent medication changes.   He continues to take eliquis  BID without bleeding concerns.     Arrhythmia/Device History Amiodarone  - stopped 10/2023 d/t no AF      ROS:  Please see the history of present illness. All other systems are reviewed and otherwise negative.    Physical Exam    VS:  BP 134/88 (BP Location: Left Arm, Patient Position: Sitting, Cuff Size: Normal)   Pulse (!) 58   Ht 5' 11 (1.803 m)   Wt 250 lb 12.8 oz (113.8 kg)   SpO2 98%   BMI 34.98 kg/m  BMI: Body mass index is 34.98 kg/m.  Wt Readings from Last 3 Encounters:  12/29/23 250  lb 12.8 oz (113.8 kg)  10/23/23 252 lb 12.8 oz (114.7 kg)  09/25/23 245 lb (111.1 kg)     GEN- The patient is well appearing, alert and oriented x 3 today.   Lungs- Clear to ausculation bilaterally, normal work of breathing.  Heart- Regular rate and rhythm, no murmurs, rubs or gallops Extremities- No peripheral edema, warm, dry   Studies Reviewed   Previous EP, cardiology notes.    EKG is ordered. Personal review of EKG from today shows:    EKG Interpretation Date/Time:  Tuesday December 29 2023 11:09:56 EDT Ventricular Rate:  58 PR Interval:  172 QRS Duration:  82 QT Interval:  442 QTC Calculation: 433 R Axis:   10  Text Interpretation: Sinus bradycardia Confirmed by Leticia Coletta [phone]) on 12/29/2023 11:13:21 AM     Coronary CTA, 09/03/2023 1. Coronary calcium score of 1470. This was 99th percentile for age and sex matched control.  2. Normal coronary origin with right dominance.  3. Mild stenosis in RCA, LAD, LCx (25-49%).  4. CAD-RADS 2. Mild non-obstructive CAD (25-49%). Consider preventive therapy and risk factor modification.  TTE, 08/19/2023  1. Left ventricular ejection fraction, by estimation, is 60 to 65%. The left ventricle has normal function. The left ventricle has no regional wall motion abnormalities. Left ventricular diastolic parameters were normal.   2. Right ventricular systolic function is normal. The right ventricular size  is normal. Tricuspid regurgitation signal is inadequate for assessing PA pressure.   3. The mitral valve is normal in structure. No evidence of mitral valve regurgitation. No evidence of mitral stenosis.   4. The aortic valve is normal in structure. Aortic valve regurgitation is not visualized. Aortic valve sclerosis is present, with no evidence of aortic valve stenosis. Aortic valve mean gradient measures 3.0 mmHg.   5. The inferior vena cava is normal in size with greater than 50% respiratory variability, suggesting right atrial pressure of  3 mmHg.    Assessment and Plan     #) persis AFib #) aflutter #) amiodarone  monitoring S/p AF, Aflutter ablation 09/2023 by Dr. Daneil Dunker He does not believe he has had any AF episodes since ablation, but is having intermittent dizziness 2 week monitor to confirm no AFib Continue 25mg  toprol   #) Hypercoag d/t persis afib CHA2DS2-VASc Score = at least 3 [CHF History: 1, HTN History: 1, Diabetes History: 0, Stroke History: 0, Vascular Disease History: 1, Age Score: 0, Gender Score: 0].  Therefore, the patient's annual risk of stroke is 3.2 %.    Stroke ppx - 5mg  eliquis  BID, appropriately dosed No bleeding concerns  #) HFimpEF NYHA II symptoms Warm and dry on exam Thought to be tachy-mediated Follows with HF team GDMT - entresto , spiro, farxiga , toprol  He takes all daily medications in the evening. I've asked him to check BP more regularly in the evening especially when having dizziness.  Has lasix  to use PRN, has never needed it  #) OSA Discussed the strong link between untreated OSA and afib He has appt soon with DR. Turner to discuss alternatives to CPAP Encouraged him to consider trailing CPAP to confirm he does not tolerate it He will consider it.         Current medicines are reviewed at length with the patient today.   The patient does not have concerns regarding his medicines.  The following changes were made today:   none  Labs/ tests ordered today include:  Orders Placed This Encounter  Procedures   LONG TERM MONITOR (3-14 DAYS)   EKG 12-Lead     Disposition:  Follow up with Dr. Alvenia Aus or PA Dunn in ~3 months Follow up with Dr. Daneil Dunker or EP APP PRN unless zio monitor is concerning     Signed, Adaline Holly, NP  12/29/23  12:06 PM  Electrophysiology [facility]

## 2023-12-29 ENCOUNTER — Ambulatory Visit: Attending: Cardiology | Admitting: Cardiology

## 2023-12-29 ENCOUNTER — Ambulatory Visit

## 2023-12-29 VITALS — BP 134/88 | HR 58 | Ht 71.0 in | Wt 250.8 lb

## 2023-12-29 DIAGNOSIS — I5032 Chronic diastolic (congestive) heart failure: Secondary | ICD-10-CM | POA: Insufficient documentation

## 2023-12-29 DIAGNOSIS — I4819 Other persistent atrial fibrillation: Secondary | ICD-10-CM | POA: Diagnosis not present

## 2023-12-29 DIAGNOSIS — D6869 Other thrombophilia: Secondary | ICD-10-CM | POA: Diagnosis not present

## 2023-12-29 NOTE — Patient Instructions (Signed)
 Medication Instructions:  The current medical regimen is effective;  continue present plan and medications as directed. Please refer to the Current Medication list given to you today.   *If you need a refill on your cardiac medications before your next appointment, please call your pharmacy*   Testing/Procedures:  ZIO XT- Long Term Monitor Instructions  Your physician has requested you wear a ZIO patch monitor for 14 days.  This is a single patch monitor. [employer] supplies one patch monitor per enrollment. Additional stickers are not available. Please do not apply patch if you will be having a Nuclear Stress Test,  Echocardiogram, Cardiac CT, MRI, or Chest Xray during the period you would be wearing the  monitor. The patch cannot be worn during these tests. You cannot remove and re-apply the  ZIO XT patch monitor.  Your ZIO patch monitor will be mailed 3 day USPS to your address on file. It may take 3-5 days  to receive your monitor after you have been enrolled.  Once you have received your monitor, please review the enclosed instructions. Your monitor  has already been registered assigning a specific monitor serial # to you.  Billing and Patient Assistance Program Information  We have supplied [employer] with any of your insurance information on file for billing purposes. [employer] offers a sliding scale Patient Assistance Program for patients that do not have  insurance, or whose insurance does not completely cover the cost of the ZIO monitor.  You must apply for the Patient Assistance Program to qualify for this discounted rate.  To apply, please call [employer] at [phone], select option 4, select option 2, ask to apply for  Patient Assistance Program. Sanna Crystal will ask your household income, and how many people  are in your household. They will quote your out-of-pocket cost based on that information.  [employer] will also be able to set up a 68-month, interest-free payment plan if  needed.  Applying the monitor   Shave hair from upper left chest.  Hold abrader disc by orange tab. Rub abrader in 40 strokes over the upper left chest as  indicated in your monitor instructions.  Clean area with 4 enclosed alcohol pads. Let dry.  Apply patch as indicated in monitor instructions. Patch will be placed under collarbone on left  side of chest with arrow pointing upward.  Rub patch adhesive wings for 2 minutes. Remove white label marked 1. Remove the white  label marked 2. Rub patch adhesive wings for 2 additional minutes.  While looking in a mirror, press and release button in center of patch. A small green light will  flash 3-4 times. This will be your only indicator that the monitor has been turned on.  Do not shower for the first 24 hours. You may shower after the first 24 hours.  Press the button if you feel a symptom. You will hear a small click. Record Date, Time and  Symptom in the Patient Logbook.  When you are ready to remove the patch, follow instructions on the last 2 pages of Patient  Logbook. Stick patch monitor onto the last page of Patient Logbook.  Place Patient Logbook in the blue and white box. Use locking tab on box and tape box closed  securely. The blue and white box has prepaid postage on it. Please place it in the mailbox as  soon as possible. Your physician should have your test results approximately 7 days after the  monitor has been mailed back to [facility].  Call  [employer] Customer Care at [phone] if you have questions regarding  your ZIO XT patch monitor. Call them immediately if you see an orange light blinking on your  monitor.  If your monitor falls off in less than 4 days, contact our Monitor department at [phone].  If your monitor becomes loose or falls off after 4 days call [employer] at [phone] for  suggestions on securing your monitor   Follow-Up: At [facility], you and your health needs are our  priority.  As part of our continuing mission to provide you with exceptional heart care, our providers are all part of one team.  This team includes your primary Cardiologist (physician) and Advanced Practice Providers or APPs (Physician Assistants and Nurse Practitioners) who all work together to provide you with the care you need, when you need it.  Your next appointment:   3 month(s)  Provider:   Antionette Kirks, MD or Varney Gentleman, PA-C     We will review monitor results and base EP follow up on those results.    We recommend signing up for the patient portal called MyChart.  Sign up information is provided on this After Visit Summary.  MyChart is used to connect with patients for Virtual Visits (Telemedicine).  Patients are able to view lab/test results, encounter notes, upcoming appointments, etc.  Non-urgent messages can be sent to your provider as well.   To learn more about what you can do with MyChart, go to [email or web address].   Other Instructions Check blood pressure at home  Try to split taking the medications and taking your shower.

## 2024-02-29 ENCOUNTER — Ambulatory Visit: Admitting: Cardiology

## 2024-03-30 ENCOUNTER — Encounter: Payer: Self-pay | Admitting: Family

## 2024-03-30 DIAGNOSIS — G720 Drug-induced myopathy: Secondary | ICD-10-CM

## 2024-03-30 DIAGNOSIS — Z789 Other specified health status: Secondary | ICD-10-CM

## 2024-03-30 DIAGNOSIS — E782 Mixed hyperlipidemia: Secondary | ICD-10-CM

## 2024-03-31 MED ORDER — REPATHA 140 MG/ML ~~LOC~~ SOSY: PREFILLED_SYRINGE | SUBCUTANEOUS | 5 refills | Status: DC

## 2024-04-01 ENCOUNTER — Telehealth: Payer: Self-pay

## 2024-04-01 ENCOUNTER — Other Ambulatory Visit (HOSPITAL_COMMUNITY): Payer: Self-pay

## 2024-04-01 DIAGNOSIS — M2550 Pain in unspecified joint: Secondary | ICD-10-CM

## 2024-04-05 ENCOUNTER — Ambulatory Visit: Attending: Physician Assistant | Admitting: Physician Assistant

## 2024-04-05 ENCOUNTER — Encounter: Payer: Self-pay | Admitting: Family

## 2024-04-05 ENCOUNTER — Ambulatory Visit (INDEPENDENT_AMBULATORY_CARE_PROVIDER_SITE_OTHER): Admitting: Family

## 2024-04-05 ENCOUNTER — Encounter: Payer: Self-pay | Admitting: Physician Assistant

## 2024-04-05 ENCOUNTER — Other Ambulatory Visit (HOSPITAL_COMMUNITY): Payer: Self-pay

## 2024-04-05 VITALS — BP 118/90 | HR 57 | Ht 71.0 in | Wt 251.4 lb

## 2024-04-05 VITALS — BP 122/86 | HR 62 | Temp 98.0°F | Ht 71.0 in | Wt 252.4 lb

## 2024-04-05 DIAGNOSIS — I4819 Other persistent atrial fibrillation: Secondary | ICD-10-CM

## 2024-04-05 DIAGNOSIS — I10 Essential (primary) hypertension: Secondary | ICD-10-CM | POA: Diagnosis not present

## 2024-04-05 DIAGNOSIS — I2510 Atherosclerotic heart disease of native coronary artery without angina pectoris: Secondary | ICD-10-CM

## 2024-04-05 DIAGNOSIS — K219 Gastro-esophageal reflux disease without esophagitis: Secondary | ICD-10-CM

## 2024-04-05 DIAGNOSIS — I5032 Chronic diastolic (congestive) heart failure: Secondary | ICD-10-CM | POA: Diagnosis not present

## 2024-04-05 DIAGNOSIS — Z789 Other specified health status: Secondary | ICD-10-CM

## 2024-04-05 DIAGNOSIS — Z79899 Other long term (current) drug therapy: Secondary | ICD-10-CM

## 2024-04-05 DIAGNOSIS — R768 Other specified abnormal immunological findings in serum: Secondary | ICD-10-CM | POA: Diagnosis not present

## 2024-04-05 DIAGNOSIS — M2550 Pain in unspecified joint: Secondary | ICD-10-CM

## 2024-04-05 DIAGNOSIS — R5383 Other fatigue: Secondary | ICD-10-CM

## 2024-04-05 DIAGNOSIS — G4733 Obstructive sleep apnea (adult) (pediatric): Secondary | ICD-10-CM

## 2024-04-05 DIAGNOSIS — E785 Hyperlipidemia, unspecified: Secondary | ICD-10-CM

## 2024-04-05 MED ORDER — MELOXICAM 5 MG PO CAPS
5.0000 mg | ORAL_CAPSULE | ORAL | 0 refills | Status: DC
Start: 2024-04-05 — End: 2024-04-07

## 2024-04-05 MED ORDER — OMEPRAZOLE 20 MG PO CPDR: DELAYED_RELEASE_CAPSULE | ORAL | 0 refills | Status: AC

## 2024-04-05 MED ORDER — METOPROLOL SUCCINATE ER 25 MG PO TB24: 12.5000 mg | ORAL_TABLET | Freq: Every day | ORAL | 11 refills | Status: AC

## 2024-04-05 NOTE — Telephone Encounter (Signed)
 Pharmacy Patient Advocate Encounter   Received notification from Onbase that prior authorization for Meloxicam  5 caps is required/requested.   Insurance verification completed.   The patient is insured through [employer] [facility] .   Per test claim: PA required; PA submitted to above mentioned insurance via Latent Key/confirmation #/EOC [id number] Status is pending

## 2024-04-05 NOTE — Progress Notes (Signed)
 Established Patient Office Visit  Subjective:      CC:  Chief Complaint  Patient presents with   Medical Management of Chronic Issues    HPI: Ernest Boyd is a 53 y.o. male presenting on 04/05/2024 for Medical Management of Chronic Issues .  Discussed the use of AI scribe software for clinical note transcription with the patient, who gave verbal consent to proceed.  History of Present Illness Ernest Boyd is a 53 year old male who presents for a follow-up to restart Repatha .  He is seeking to restart Repatha , which he had previously stopped due to issues related to his heart and lack of approval. He had been taking it regularly, but it was neglected, and the remaining doses are now expired. He took the last valid dose a couple of weeks ago and is now due for more.  He maintains an active lifestyle at work, involving tasks like climbing ladders, but does not have a scheduled exercise routine. He has not experienced any falls from ladders. His breathing has improved significantly, and he has not needed to use his inhaler frequently. He occasionally experiences congestion but still has an inhaler with approximately seventy doses remaining.  He underwent a sleep study in February, which indicated mild obstructive sleep apnea. He has not pursued further treatment such as CPAP due to discomfort with the idea and reports rolling around a lot in bed. He experiences numbness in his right arm when sleeping on his right side, which has been occurring for about thirty years. The numbness is localized to his hand and is associated with certain arm positions. He occasionally experiences neck tightness, which he attributes to lifting heavy objects.  He has a history of carpal tunnel syndrome, particularly from his time working at [employer] in 2000, where he engaged in repetitive lifting.  He is currently taking meloxicam  5 mg every three days for knee pain, which he finds very helpful. He is aware of  the bleeding risk associated with concurrent use of Eliquis  and meloxicam  and monitors for any signs of gastrointestinal bleeding. He has not experienced any stomach issues with this regimen.         Social history:  Relevant past medical, surgical, family and social history reviewed and updated as indicated. Interim medical history since our last visit reviewed.  Allergies and medications reviewed and updated.  DATA REVIEWED: CHART IN EPIC     ROS: Negative unless specifically indicated above in HPI.    Current Outpatient Medications:    acetaminophen  (TYLENOL ) 500 MG tablet, Take 500 mg by mouth every 6 (six) hours as needed for moderate pain (pain score 4-6)., Disp: , Rfl:    apixaban  (ELIQUIS ) 5 MG TABS tablet, Take 1 tablet (5 mg total) by mouth 2 (two) times daily., Disp: 60 tablet, Rfl: 11   calcium carbonate (TUMS - DOSED IN MG ELEMENTAL CALCIUM) 500 MG chewable tablet, Chew 1 tablet by mouth as needed for indigestion or heartburn., Disp: , Rfl:    dapagliflozin  propanediol (FARXIGA ) 10 MG TABS tablet, Take 1 tablet (10 mg total) by mouth daily., Disp: 30 tablet, Rfl: 11   Evolocumab  (REPATHA ) 140 MG/ML SOSY, Inject one injection sq every two weeks, Disp: 2 mL, Rfl: 5   Meloxicam  5 MG CAPS, Take 5 mg by mouth every 3 (three) days., Disp: , Rfl:    metoprolol  succinate (TOPROL  XL) 25 MG 24 hr tablet, Take 0.5 tablets (12.5 mg total) by mouth daily., Disp: 15 tablet,  Rfl: 11   omeprazole  (PRILOSEC) 20 MG capsule, Take one po prn, take when taking meloxicam , Disp: 30 capsule, Rfl: 0   sacubitril -valsartan  (ENTRESTO ) 97-103 MG, Take 1 tablet by mouth 2 (two) times daily., Disp: 60 tablet, Rfl: 11   spironolactone  (ALDACTONE ) 25 MG tablet, Take 1 tablet (25 mg total) by mouth daily., Disp: 30 tablet, Rfl: 11        Objective:        BP 122/86 (BP Location: Left Arm, Patient Position: Sitting, Cuff Size: Normal)   Pulse 62   Temp 98 F (36.7 C) (Temporal)   Ht 5' 11  (1.803 m)   Wt 252 lb 6.4 oz (114.5 kg)   SpO2 98%   BMI 35.20 kg/m   Physical Exam MEASUREMENTS: Weight- 250. MUSCULOSKELETAL: Hand non-tender. Tinel's test negative.  Wt Readings from Last 3 Encounters:  04/05/24 252 lb 6.4 oz (114.5 kg)  04/05/24 251 lb 6.4 oz (114 kg)  12/29/23 250 lb 12.8 oz (113.8 kg)    Physical Exam Vitals reviewed.  Constitutional:      General: He is not in acute distress.    Appearance: Normal appearance. He is obese. He is not ill-appearing, toxic-appearing or diaphoretic.  Cardiovascular:     Rate and Rhythm: Normal rate and regular rhythm.  Pulmonary:     Effort: Pulmonary effort is normal.  Musculoskeletal:        General: Normal range of motion.     Right hand: Normal range of motion. Normal strength. Normal sensation.     Right lower leg: No edema.     Left lower leg: No edema.     Comments: Neg phalens  Neurological:     General: No focal deficit present.     Mental Status: He is alert and oriented to person, place, and time. Mental status is at baseline.  Psychiatric:        Mood and Affect: Mood normal.        Behavior: Behavior normal.        Thought Content: Thought content normal.        Judgment: Judgment normal.          Results LABS Titer: 1:80 (09/2023)  DIAGNOSTIC Sleep study: Mild obstructive sleep apnea (08/2023)  Assessment & Plan:   Assessment and Plan Assessment & Plan Atrial fibrillation History of atrial fibrillation with improved breathing. No recent need for inhaler. Cardiologist involved in care.  Obstructive sleep apnea, mild Diagnosed with mild obstructive sleep apnea in February. Hesitant to use CPAP due to discomfort while sleeping. Advised that sleep apnea can contribute to atrial fibrillation. - Encourage follow-up with a pulmonary specialist for sleep apnea management. - Discuss alternative options to CPAP if available.  Hyperlipidemia Hyperlipidemia with previous use of Repatha ,  discontinued due to heart-related issues and lack of approval. Cardiologist recommended resuming Repatha . - Refill Repatha  prescription at [facility]. - Advise to follow a low cholesterol diet. - Encourage regular physical activity.  Right hand numbness, likely carpal tunnel syndrome Experiences numbness in right hand, particularly when sleeping on right side. Ongoing for 30 years. Likely due to carpal tunnel syndrome, exacerbated by certain sleeping positions and possibly neck tightness. - Recommend wearing a wrist brace at night to keep the wrist straight.  Knee pain Experiences knee pain. Takes meloxicam  every three days for relief. On Eliquis , increasing bleeding risk. Reports no stomach issues with meloxicam . - Advise taking omeprazole  on days when meloxicam  is taken to protect the stomach lining. -  Monitor for signs of gastrointestinal bleeding, such as blood in the stool.  Recording duration: 14 minutes      No follow-ups on file.     Ginger Patrick, MSN, APRN, FNP-C  [facility]
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
## 2024-04-05 NOTE — Patient Instructions (Signed)
 Medication Instructions:  Your physician recommends the following medication changes.  DECREASE: Metoprolol  Succinate (Toprol -XL) 12.5 mg daily  *If you need a refill on your cardiac medications before your next appointment, please call your pharmacy*  Lab Work: Your provider would like for you to have following labs drawn today CBC, CMP, and Lipids.   If you have labs (blood work) drawn today and your tests are completely normal, you will receive your results only by: MyChart Message (if you have MyChart) OR A paper copy in the mail If you have any lab test that is abnormal or we need to change your treatment, we will call you to review the results.  Follow-Up: At [facility], you and your health needs are our priority.  As part of our continuing mission to provide you with exceptional heart care, our providers are all part of one team.  This team includes your primary Cardiologist (physician) and Advanced Practice Providers or APPs (Physician Assistants and Nurse Practitioners) who all work together to provide you with the care you need, when you need it.  Your next appointment:   6 month(s)  Provider:   You may see Deatrice Cage, MD or Bernardino Bring, PA-C

## 2024-04-06 ENCOUNTER — Ambulatory Visit: Payer: Self-pay | Admitting: Physician Assistant

## 2024-04-06 ENCOUNTER — Other Ambulatory Visit (HOSPITAL_COMMUNITY): Payer: Self-pay

## 2024-04-06 LAB — CBC
Hematocrit: 50.1 % (ref 37.5–51.0)
Hemoglobin: 16.4 g/dL (ref 13.0–17.7)
MCH: 29.5 pg (ref 26.6–33.0)
MCHC: 32.7 g/dL (ref 31.5–35.7)
MCV: 90 fL (ref 79–97)
Platelets: 268 x10E3/uL (ref 150–450)
RBC: 5.56 x10E6/uL (ref 4.14–5.80)
RDW: 12.6 % (ref 11.6–15.4)
WBC: 6.7 x10E3/uL (ref 3.4–10.8)

## 2024-04-06 LAB — COMPREHENSIVE METABOLIC PANEL WITH GFR
ALT: 21 IU/L (ref 0–44)
AST: 20 IU/L (ref 0–40)
Albumin: 4.3 g/dL (ref 3.8–4.9)
Alkaline Phosphatase: 52 IU/L (ref 47–123)
BUN/Creatinine Ratio: 11 (ref 9–20)
BUN: 11 mg/dL (ref 6–24)
Bilirubin Total: 0.5 mg/dL (ref 0.0–1.2)
CO2: 20 mmol/L (ref 20–29)
Calcium: 9.5 mg/dL (ref 8.7–10.2)
Chloride: 101 mmol/L (ref 96–106)
Creatinine, Ser: 0.99 mg/dL (ref 0.76–1.27)
Globulin, Total: 2.7 g/dL (ref 1.5–4.5)
Glucose: 112 mg/dL — ABNORMAL HIGH (ref 70–99)
Potassium: 4.4 mmol/L (ref 3.5–5.2)
Sodium: 138 mmol/L (ref 134–144)
Total Protein: 7 g/dL (ref 6.0–8.5)
eGFR: 91 mL/min/1.73 (ref >59)

## 2024-04-06 LAB — LIPID PANEL
Chol/HDL Ratio: 3.4 ratio (ref 0.0–5.0)
Cholesterol, Total: 161 mg/dL (ref 100–199)
HDL: 47 mg/dL (ref >39)
LDL Chol Calc (NIH): 91 mg/dL (ref 0–99)
Triglycerides: 128 mg/dL (ref 0–149)
VLDL Cholesterol Cal: 23 mg/dL (ref 5–40)

## 2024-04-06 NOTE — Telephone Encounter (Signed)
 Pharmacy Patient Advocate Encounter  Received notification from [facility] that Prior Authorization for Meloxicam  5 has been DENIED.  Full denial letter will be uploaded to the media tab. See denial reason below.     PA #/Case ID/Reference #: # [id number]

## 2024-04-07 MED ORDER — MELOXICAM 7.5 MG PO TABS: 7.5000 mg | ORAL_TABLET | Freq: Every day | ORAL | 2 refills | Status: AC

## 2024-04-14 ENCOUNTER — Encounter (HOSPITAL_BASED_OUTPATIENT_CLINIC_OR_DEPARTMENT_OTHER): Payer: Self-pay | Admitting: Emergency Medicine

## 2024-04-14 ENCOUNTER — Emergency Department (HOSPITAL_BASED_OUTPATIENT_CLINIC_OR_DEPARTMENT_OTHER)
Admission: EM | Admit: 2024-04-14 | Discharge: 2024-04-14 | Disposition: A | Discharge status: Home / Self Care | Attending: Emergency Medicine | Admitting: Emergency Medicine

## 2024-04-14 ENCOUNTER — Other Ambulatory Visit: Payer: Self-pay

## 2024-04-14 ENCOUNTER — Emergency Department (HOSPITAL_BASED_OUTPATIENT_CLINIC_OR_DEPARTMENT_OTHER): Admitting: Radiology

## 2024-04-14 DIAGNOSIS — I5089 Other heart failure: Secondary | ICD-10-CM | POA: Insufficient documentation

## 2024-04-14 DIAGNOSIS — Z7901 Long term (current) use of anticoagulants: Secondary | ICD-10-CM | POA: Insufficient documentation

## 2024-04-14 DIAGNOSIS — I110 Hypertensive heart disease with heart failure: Secondary | ICD-10-CM | POA: Insufficient documentation

## 2024-04-14 DIAGNOSIS — Z79899 Other long term (current) drug therapy: Secondary | ICD-10-CM | POA: Diagnosis not present

## 2024-04-14 DIAGNOSIS — I482 Chronic atrial fibrillation, unspecified: Secondary | ICD-10-CM | POA: Insufficient documentation

## 2024-04-14 DIAGNOSIS — R079 Chest pain, unspecified: Secondary | ICD-10-CM | POA: Insufficient documentation

## 2024-04-14 LAB — CBC WITH DIFFERENTIAL/PLATELET
Abs Immature Granulocytes: 0.01 K/uL (ref 0.00–0.07)
Basophils Absolute: 0 K/uL (ref 0.0–0.1)
Basophils Relative: 1 %
Eosinophils Absolute: 0.3 K/uL (ref 0.0–0.5)
Eosinophils Relative: 5 %
HCT: 47.6 % (ref 39.0–52.0)
Hemoglobin: 16.9 g/dL (ref 13.0–17.0)
Immature Granulocytes: 0 %
Lymphocytes Relative: 28 %
Lymphs Abs: 1.8 K/uL (ref 0.7–4.0)
MCH: 29.7 pg (ref 26.0–34.0)
MCHC: 35.5 g/dL (ref 30.0–36.0)
MCV: 83.7 fL (ref 80.0–100.0)
Monocytes Absolute: 0.4 K/uL (ref 0.1–1.0)
Monocytes Relative: 7 %
Neutro Abs: 3.7 K/uL (ref 1.7–7.7)
Neutrophils Relative %: 59 %
Platelets: 252 K/uL (ref 150–400)
RBC: 5.69 MIL/uL (ref 4.22–5.81)
RDW: 12.3 % (ref 11.5–15.5)
WBC: 6.3 K/uL (ref 4.0–10.5)
nRBC: 0 % (ref 0.0–0.2)

## 2024-04-14 LAB — BASIC METABOLIC PANEL WITH GFR
Anion gap: 14 (ref 5–15)
BUN: 11 mg/dL (ref 6–20)
CO2: 21 mmol/L — ABNORMAL LOW (ref 22–32)
Calcium: 10.5 mg/dL — ABNORMAL HIGH (ref 8.9–10.3)
Chloride: 103 mmol/L (ref 98–111)
Creatinine, Ser: 0.98 mg/dL (ref 0.61–1.24)
GFR, Estimated: >60 mL/min (ref >60)
Glucose, Bld: 115 mg/dL — ABNORMAL HIGH (ref 70–99)
Potassium: 4.1 mmol/L (ref 3.5–5.1)
Sodium: 137 mmol/L (ref 135–145)

## 2024-04-14 LAB — TROPONIN T, HIGH SENSITIVITY
Troponin T High Sensitivity: <15 ng/L (ref 0–19)
Troponin T High Sensitivity: <15 ng/L (ref 0–19)

## 2024-04-14 LAB — PRO BRAIN NATRIURETIC PEPTIDE: Pro Brain Natriuretic Peptide: <50 pg/mL (ref <300.0)

## 2024-04-14 LAB — D-DIMER, QUANTITATIVE: D-Dimer, Quant: <0.27 ug{FEU}/mL (ref 0.00–0.50)

## 2024-04-14 LAB — MAGNESIUM: Magnesium: 2.1 mg/dL (ref 1.7–2.4)

## 2024-04-14 MED ORDER — ALUM & MAG HYDROXIDE-SIMETH 200-200-20 MG/5ML PO SUSP
30.0000 mL | Freq: Once | ORAL | Status: DC
Start: 2024-04-14 — End: 2024-04-14

## 2024-04-14 MED ORDER — LIDOCAINE VISCOUS HCL 2 % MT SOLN: 15.0000 mL | Freq: Once | OROMUCOSAL | Status: DC

## 2024-04-14 NOTE — Discharge Instructions (Addendum)
 Ernest Boyd  Thank you for allowing us  to take care of you today.  You came to the Emergency Department today because of an episode of severe chest pain today.  Here in the emergency department your heart failure numbers not elevated, your blood clot risk factor number was also not elevated.  Your troponin, your heart enzyme which rises when you have a heart attack, is not elevated.  Your chest x-ray and blood counts were also reassuring.  This means that your symptoms are not related to an emergency cause of chest pain that would need to bring you to the hospital.  We recommend following up with your primary care doctor.  It is possible that your symptoms are related to reflux, we recommend taking a PPI such as omeprazole /Prilosec for 2 weeks and see if this helps.   To-Do: 1. Please follow-up with your primary doctor within 1 to 2 weeks/ as soon as possible.   Please return to the Emergency Department or call 911 if you experience have worsening of your symptoms, or do not get better, new or different chest pain, shortness of breath, severe or significantly worsening pain, high fever, severe confusion, pass out or have any reason to think that you need emergency medical care.   We hope you feel better soon.   Mitzie Later, MD Department of Emergency Medicine [facility]

## 2024-04-14 NOTE — ED Notes (Signed)
 Called lab to check on d-dimer. Lab reports in process

## 2024-04-14 NOTE — ED Triage Notes (Signed)
 Pt reports acute chest tightness, heaviness with shob and right side radiation that started ~ 30 mins pta.

## 2024-04-14 NOTE — ED Provider Notes (Signed)
 [place] EMERGENCY DEPARTMENT AT [facility] Provider Note   CSN: [id number] Arrival date & time: 04/14/24  9066     History Chief Complaint  Patient presents with   Chest Pain    HPI: KALVYN DESA is a 53 y.o. male with history pertinent for hypertension, hyperlipidemia, elevated BMI, A-fib on Eliquis  status post ablation, cardiomyopathy/HFpEF on Entresto  who presents complaining of chest pain. Patient arrived via POV.  History provided by patient.  No interpreter required during this encounter.  Patient reports that he was diagnosed with atrial fibrillation last year, and had heart failure diagnosed concurrently.  Reports that he underwent several unsuccessful cardioversions, and had an ablation earlier this year.  Reports that he has been feeling well up until this morning when he developed generalized chest tightness, and a dull ache on his right sided chest radiating to his axilla.  Denies radiation to his arms, neck, jaw, back.  Denies substernal chest pressure, denies previous similar pain.  No specific aggravating or alleviating factors, mild associated shortness of breath.  Denies fever, chills, nausea, vomiting, diarrhea, diaphoresis, abdominal pain, diarrhea.  Reports that he has been adherent to all of his home medications, his last dose of Eliquis  was this morning.  Patient's recorded medical, surgical, social, medication list and allergies were reviewed in the Snapshot window as part of the initial history.   Prior to Admission medications   Medication Sig Start Date End Date Taking? Authorizing Provider  acetaminophen  (TYLENOL ) 500 MG tablet Take 500 mg by mouth every 6 (six) hours as needed for moderate pain (pain score 4-6).    [provider]  apixaban  (ELIQUIS ) 5 MG TABS tablet Take 1 tablet (5 mg total) by mouth 2 (two) times daily. 07/29/23   Sabharwal, Aditya, DO  calcium carbonate (TUMS - DOSED IN MG ELEMENTAL CALCIUM) 500 MG chewable tablet Chew 1  tablet by mouth as needed for indigestion or heartburn.    [provider]  dapagliflozin  propanediol (FARXIGA ) 10 MG TABS tablet Take 1 tablet (10 mg total) by mouth daily. 07/29/23   Sabharwal, Aditya, DO  Evolocumab  (REPATHA ) 140 MG/ML SOSY Inject one injection sq every two weeks 03/31/24   Corwin Antu, FNP  meloxicam  (MOBIC ) 7.5 MG tablet Take 1 tablet (7.5 mg total) by mouth daily. 04/07/24   Dugal, Tabitha, FNP  metoprolol  succinate (TOPROL  XL) 25 MG 24 hr tablet Take 0.5 tablets (12.5 mg total) by mouth daily. 04/05/24   Abigail Bernardino HERO, PA-C  omeprazole  (PRILOSEC) 20 MG capsule Take one po prn, take when taking meloxicam  04/05/24   Dugal, Tabitha, FNP  sacubitril -valsartan  (ENTRESTO ) 97-103 MG Take 1 tablet by mouth 2 (two) times daily. 07/29/23   Sabharwal, Aditya, DO  spironolactone  (ALDACTONE ) 25 MG tablet Take 1 tablet (25 mg total) by mouth daily. 07/29/23   Sabharwal, Ria, DO     Allergies: Dilantin [phenytoin], Morphine and codeine, Nexlizet [bempedoic acid-ezetimibe], Simvastatin, and Vicodin hp [hydrocodone-acetaminophen ]   Review of Systems   ROS as per HPI  Physical Exam Updated Vital Signs BP (!) 119/101   Pulse 63   Temp 97.8 F (36.6 C) (Oral)   Resp (!) 22   Wt 113.4 kg   SpO2 97%   BMI 34.87 kg/m  Physical Exam Vitals and nursing note reviewed.  Constitutional:      General: He is not in acute distress.    Appearance: He is well-developed.  HENT:     Head: Normocephalic and atraumatic.  Eyes:  Conjunctiva/sclera: Conjunctivae normal.  Cardiovascular:     Rate and Rhythm: Normal rate and regular rhythm.     Pulses:          Radial pulses are 2+ on the right side and 2+ on the left side.     Heart sounds: No murmur heard. Pulmonary:     Effort: Pulmonary effort is normal. No respiratory distress.     Breath sounds: Normal breath sounds.  Abdominal:     Palpations: Abdomen is soft.     Tenderness: There is no abdominal tenderness.   Musculoskeletal:        General: No swelling.     Cervical back: Neck supple.  Skin:    General: Skin is warm and dry.     Capillary Refill: Capillary refill takes less than 2 seconds.  Neurological:     Mental Status: He is alert.  Psychiatric:        Mood and Affect: Mood normal.     ED Course/ Medical Decision Making/ A&P    Procedures Procedures   Medications Ordered in ED Medications  alum & mag hydroxide-simeth (MAALOX/MYLANTA) 200-200-20 MG/5ML suspension 30 mL (30 mLs Oral Patient Refused/Not Given 04/14/24 1517)    And  lidocaine  (XYLOCAINE ) 2 % viscous mouth solution 15 mL (15 mLs Oral Patient Refused/Not Given 04/14/24 1517)    Medical Decision Making:   RIDHAAN DREIBELBIS is a 53 y.o. male who presents for chest pain as per above.  Physical exam is pertinent for no focal abnormality.   The differential includes but is not limited to ACS, arrhythmia, pericardial tamponade, pericarditis, myocarditis, pneumonia, pneumothorax, esophageal, tear, perforated abdominal viscous, pulmonary embolism, aortic dissection, costochondritis, musculoskeletal chest wall pain, GERD.  Independent historian: None  External data reviewed: Notes reviewed prior cardiology notes, and prior echocardiogram with most recent EF of 60 to 65% in February 2025  Labs: Ordered, Independent interpretation, and Details: CBC without leukocytosis, anemia, thrombocytopenia. BMP without AKI, emergent electrolyte derangement.  Magnesium WNL.  Dimer undetectable.  BNP undetectable.  For troponin undetectable, delta undetectable.  Radiology: Ordered, Independent interpretation, Details: Chest x-ray without focal airspace opacification, cardiomediastinal fluid derangement, pneumothorax, pleural effusion, bony derangement, and All images reviewed independently.  Agree with radiology report at this time.   DG Chest 2 View Result Date: 04/14/2024 CLINICAL DATA:  Chest pain. EXAM: CHEST - 2 VIEW COMPARISON:   07/16/2023. FINDINGS: The heart size and mediastinal contours are within normal limits. No focal consolidation, pleural effusion, or pneumothorax. No acute osseous abnormality. IMPRESSION: No acute cardiopulmonary findings. Electronically Signed   By: Harrietta Sherry M.D.   On: 04/14/2024 11:21    EKG/Medicine tests: Ordered and Independent interpretation EKG Interpretation Date/Time:  Thursday April 14 2024 11:20:06 EDT Ventricular Rate:  52 PR Interval:  180 QRS Duration:  98 QT Interval:  467 QTC Calculation: 435 R Axis:   41  Text Interpretation: Sinus rhythm RSR' in V1 or V2, right VCD or RVH No significant change since last tracing Confirmed by Rogelia Satterfield ([id number]) on 04/14/2024 11:36:06 AM                Interventions: None  See the EMR for full details regarding lab and imaging results.  The ECG reveals no anatomical ischemia representing STEMI, New-Onset Arrhythmia, or ischemic equivalent. He has been risk stratified with a HEAR score of 4. Initial troponin is undetectable; delta troponin is undetectable.  The patient's presentation, the patient being hemodynamically stable, and the ECG are not  consistent with Pericardial Tamponade. The patient's pain is not positional. This in conjunction with the lack of PR depressions and ST elevations on the ECG are reassuring against Pericarditis. The patient's non-elevated troponin and ECG are also inconsistent with Myocarditis.  The CXR is unremarkable for focal airspace disease.  The patient is afebrile and denies productive cough.  Therefore, I do not suspect Pneumonia. There is no evidence of Pneumothorax on physical exam or on the CXR. CXR shows no evidence of Esophageal Tear and there is no recent intractable emesis or esophageal instrumentation. There is no peritonitis or free air on CXR worrisome for a Perforated Abdominal Viscous.  Pulmonary Embolism is on the differential. The patient is at  risk via the Revised Geneva  Criteria. Therefore, we will further risk stratify the patient with a d-dimer.  This was undetectable. Therefore, a CTA not indicated.  The patient's pain is not tearing and it does not radiate to back. Pulses are present bilaterally in both the upper and lower extremities. CXR does not show a widened mediastinum. I have a very low suspicion for Aortic Dissection.  Doubt heart failure exacerbation given patient without elevation of BNP, no rales, no lower extremity edema.  On reevaluation patient had spontaneous resolution of his symptoms.  Discussed that reassuring workup makes emergent underlying pathology unlikely, and he is appropriate for outpatient follow-up with PCP.  Wife had presented to bedside, does note that patient did have increased GERD symptoms last night and took some Tums for pain relief.  Offered Maalox for GERD, patient ultimately declined, felt comfortable with plan for outpatient follow-up.  Presentation is most consistent with acute complicated illness and I did consider and rule out acute life/limb-threatening illness  Discussion of management or test interpretations with external provider(s): Not indicated  Risk Drugs:None  Disposition: DISCHARGE: I believe that the patient is safe for discharge home with outpatient follow-up. Patient was informed of all pertinent physical exam, laboratory, and imaging findings.  Patient's suspected etiology of their symptom presentation was discussed with the patient and all questions were answered. We discussed following up with PCP. I provided thorough ED return precautions. The patient feels safe and comfortable with this plan.  MDM generated using voice dictation software and may contain dictation errors.  Please contact me for any clarification or with any questions.   Clinical Impression:  1. Chest pain, unspecified type      Discharge   Final Clinical Impression(s) / ED Diagnoses Final diagnoses:  Chest pain, unspecified  type    Rx / DC Orders ED Discharge Orders     None        Rogelia Jerilynn RAMAN, MD 04/14/24 1732

## 2024-05-03 NOTE — Progress Notes (Signed)
 Last read by [name] at 12:39PM on 04/19/2024.

## 2024-05-11 ENCOUNTER — Ambulatory Visit (HOSPITAL_BASED_OUTPATIENT_CLINIC_OR_DEPARTMENT_OTHER): Admitting: Orthopaedic Surgery

## 2024-05-16 ENCOUNTER — Encounter: Payer: Self-pay | Admitting: Radiology

## 2024-05-18 ENCOUNTER — Other Ambulatory Visit: Payer: Self-pay | Admitting: Emergency Medicine

## 2024-05-18 DIAGNOSIS — E782 Mixed hyperlipidemia: Secondary | ICD-10-CM

## 2024-05-18 DIAGNOSIS — Z79899 Other long term (current) drug therapy: Secondary | ICD-10-CM

## 2024-05-18 DIAGNOSIS — Z789 Other specified health status: Secondary | ICD-10-CM

## 2024-05-18 DIAGNOSIS — G720 Drug-induced myopathy: Secondary | ICD-10-CM

## 2024-05-18 MED ORDER — REPATHA 140 MG/ML ~~LOC~~ SOSY: 140.0000 mg | PREFILLED_SYRINGE | SUBCUTANEOUS | 3 refills | Status: AC

## 2024-05-19 ENCOUNTER — Other Ambulatory Visit (HOSPITAL_COMMUNITY): Payer: Self-pay

## 2024-05-19 ENCOUNTER — Telehealth: Payer: Self-pay | Admitting: Pharmacy Technician

## 2024-05-19 NOTE — Telephone Encounter (Signed)
 Pharmacy Patient Advocate Encounter   Received notification from Fax that prior authorization for repatha  is required/requested.   Insurance verification completed.   The patient is insured through [employer].   Per test claim: PA required; PA submitted to above mentioned insurance via Fax Key/confirmation #/EOC faxed Status is pending

## 2024-05-19 NOTE — Telephone Encounter (Signed)
 [employer] responded that no PA is required on patients plan: attached in media

## 2024-06-08 ENCOUNTER — Other Ambulatory Visit (HOSPITAL_COMMUNITY): Payer: Self-pay

## 2024-06-08 NOTE — Telephone Encounter (Signed)
 Pt significant other called in stating pharmacy states pt still needs PA    Spoke with Devere the pharmacist at [facility]. She states they tried running it through today and it is saying pa was cancelled by prescriber. Please advise.

## 2024-06-10 ENCOUNTER — Other Ambulatory Visit (HOSPITAL_COMMUNITY): Payer: Self-pay

## 2024-06-10 ENCOUNTER — Telehealth: Payer: Self-pay | Admitting: Pharmacy Technician

## 2024-06-10 NOTE — Telephone Encounter (Signed)
 I got patient a coupon to make 15.00    Pharmacy opens at [facility]

## 2024-06-10 NOTE — Telephone Encounter (Signed)
 Per test claim this goes through just fine 45.00 for 28 days pa expires 06/08/25    I got patient a coupon to make 15.00    Pharmacy opens at 9am  I called [facility] and it did go through for them and now 15.00  I called both numbers on the patients file and [phone] vm box is full but I lmom on [phone]

## 2024-06-24 ENCOUNTER — Other Ambulatory Visit (HOSPITAL_BASED_OUTPATIENT_CLINIC_OR_DEPARTMENT_OTHER): Payer: Self-pay

## 2024-06-24 ENCOUNTER — Ambulatory Visit (HOSPITAL_BASED_OUTPATIENT_CLINIC_OR_DEPARTMENT_OTHER): Admitting: Orthopaedic Surgery

## 2024-06-24 ENCOUNTER — Ambulatory Visit (INDEPENDENT_AMBULATORY_CARE_PROVIDER_SITE_OTHER)

## 2024-06-24 DIAGNOSIS — G8929 Other chronic pain: Secondary | ICD-10-CM

## 2024-06-24 DIAGNOSIS — M25561 Pain in right knee: Secondary | ICD-10-CM | POA: Diagnosis not present

## 2024-06-24 DIAGNOSIS — M25562 Pain in left knee: Secondary | ICD-10-CM | POA: Diagnosis not present

## 2024-06-24 MED ORDER — LIDOCAINE HCL 1 % IJ SOLN
4.0000 mL | INTRAMUSCULAR | Status: AC | PRN
  Administered 2024-06-24: 4 mL

## 2024-06-24 MED ORDER — TRIAMCINOLONE ACETONIDE 40 MG/ML IJ SUSP
80.0000 mg | INTRAMUSCULAR | Status: AC | PRN
  Administered 2024-06-24: 80 mg via INTRA_ARTICULAR
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
## 2024-07-01 ENCOUNTER — Ambulatory Visit (HOSPITAL_BASED_OUTPATIENT_CLINIC_OR_DEPARTMENT_OTHER): Admitting: Orthopaedic Surgery

## 2024-07-27 ENCOUNTER — Ambulatory Visit (INDEPENDENT_AMBULATORY_CARE_PROVIDER_SITE_OTHER): Admitting: Orthopaedic Surgery

## 2024-07-27 ENCOUNTER — Ambulatory Visit (HOSPITAL_BASED_OUTPATIENT_CLINIC_OR_DEPARTMENT_OTHER)

## 2024-07-27 ENCOUNTER — Other Ambulatory Visit (HOSPITAL_BASED_OUTPATIENT_CLINIC_OR_DEPARTMENT_OTHER): Payer: Self-pay

## 2024-07-27 DIAGNOSIS — M25511 Pain in right shoulder: Secondary | ICD-10-CM

## 2024-07-27 DIAGNOSIS — G8929 Other chronic pain: Secondary | ICD-10-CM | POA: Diagnosis not present

## 2024-07-27 DIAGNOSIS — M19011 Primary osteoarthritis, right shoulder: Secondary | ICD-10-CM

## 2024-07-27 MED ORDER — TRIAMCINOLONE ACETONIDE 40 MG/ML IJ SUSP
80.0000 mg | INTRAMUSCULAR | Status: AC | PRN
  Administered 2024-07-27: 80 mg via INTRA_ARTICULAR

## 2024-07-27 MED ORDER — LIDOCAINE HCL 1 % IJ SOLN
4.0000 mL | INTRAMUSCULAR | Status: AC | PRN
  Administered 2024-07-27: 4 mL
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
